# Patient Record
Sex: Male | Born: 1970 | Race: White | Hispanic: No | Marital: Married
Health system: Southern US, Academic
[De-identification: ages and names within clinical notes are randomized; demographics above are authoritative.]

---

## 2018-04-09 ENCOUNTER — Other Ambulatory Visit (HOSPITAL_COMMUNITY): Payer: Self-pay | Admitting: Surgery

## 2021-02-16 IMAGING — MR MRI KNEE RT W/O CONTRAST
4 of 5 series · 28 of 40 positions shown · IV contrast (gadolinium)
Comparison: None.

﻿EXAM:  [id number]   MRI KNEE RT W/O CONTRAST
INDICATION: Right knee pain, injury 7 months ago.
TECHNIQUE: Multiplanar, multisequential MRI of the right knee joint was performed without gadolinium contrast.

[Series 5: PD fat-sat · axial · right · 4.0mm · 0.37mm/px · z∈[-45,+86]mm · 8 of 30 slices shown (1 of 3)]
[im 1/30]
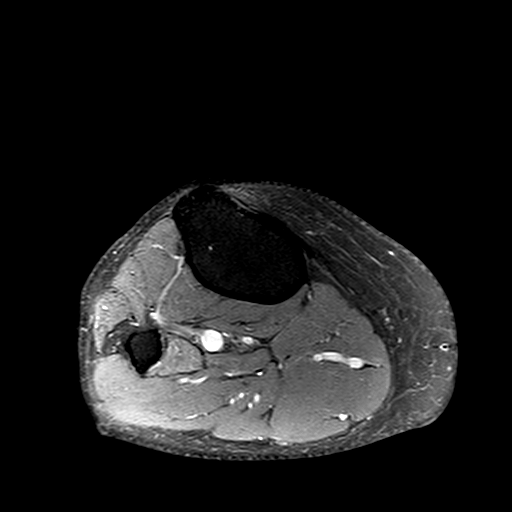
[im 5/30]
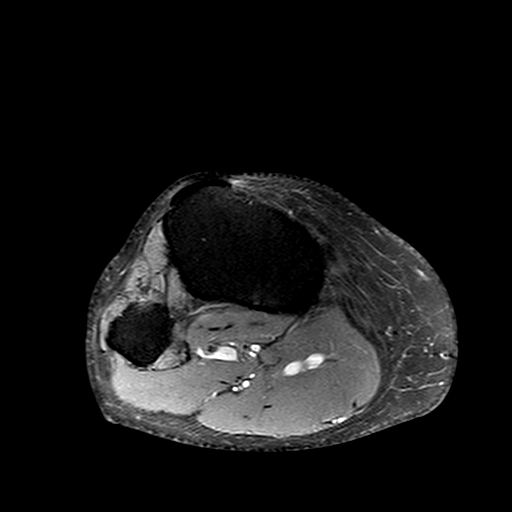
[im 9/30]
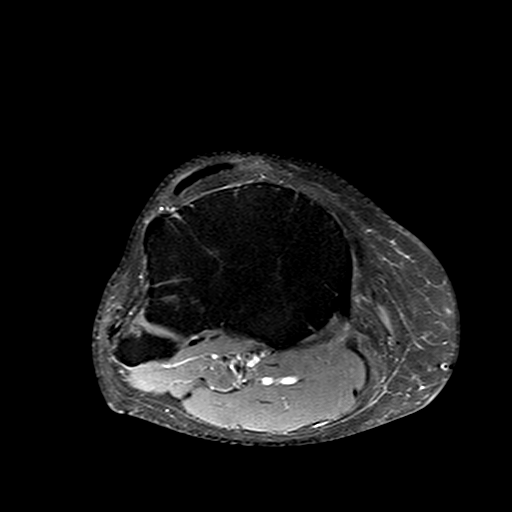
[im 13/30]
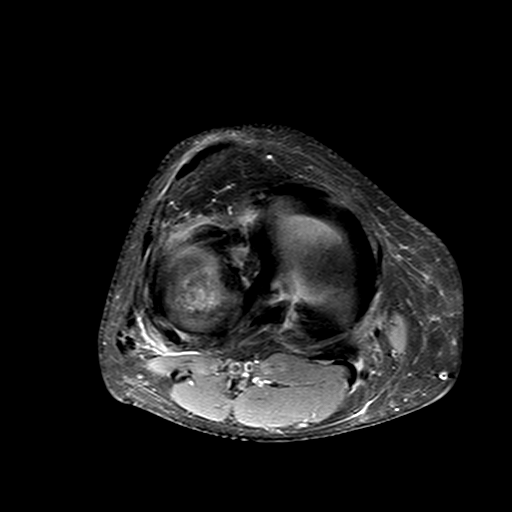
[im 17/30]
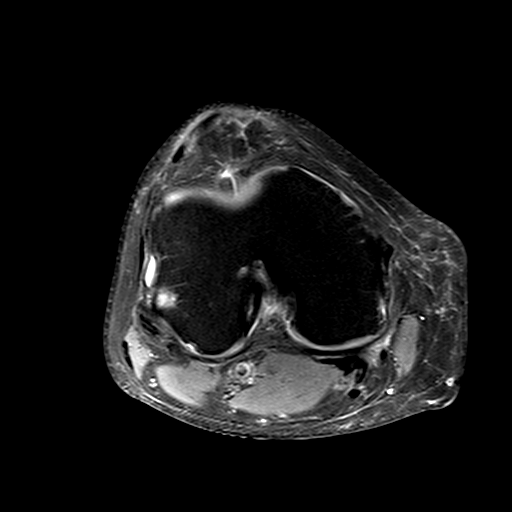
[im 21/30]
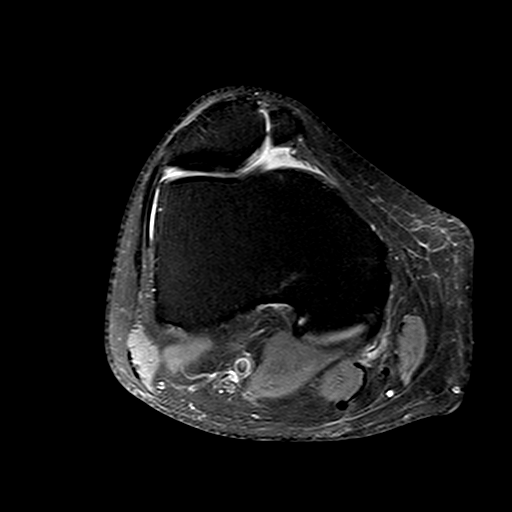
[im 25/30]
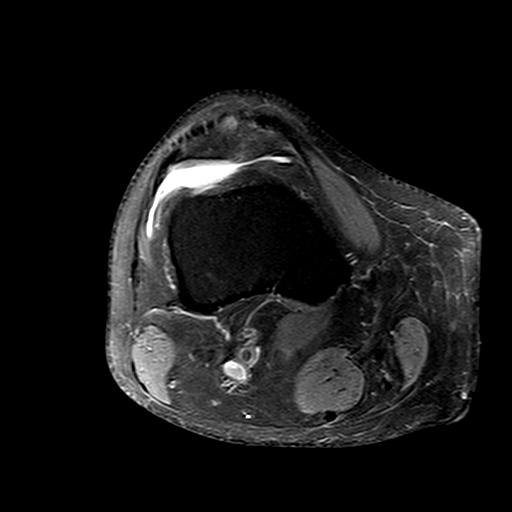
[im 30/30]
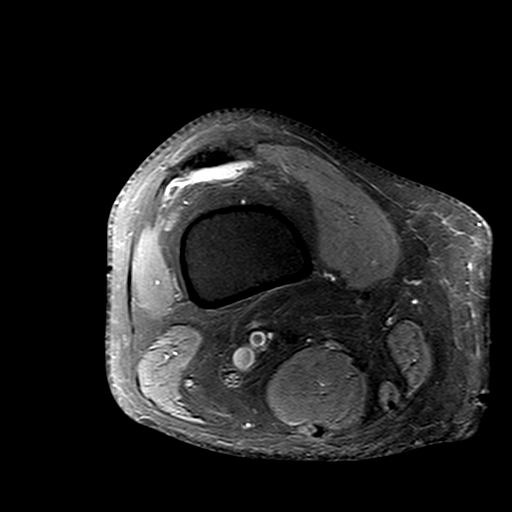

[Series 6: PD fat-sat · sagittal · right · 3.0mm · 0.50mm/px · 8 of 30 slices shown (2 of 3)]
[im 1/30]
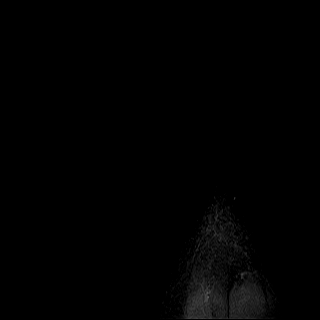
[im 5/30]
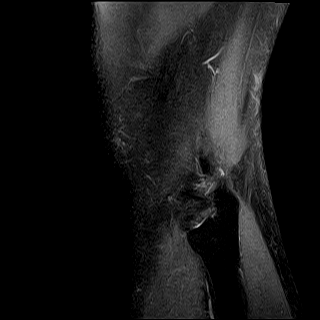
[im 9/30]
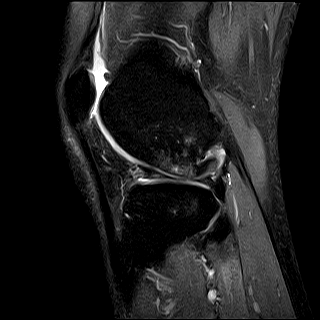
[im 13/30]
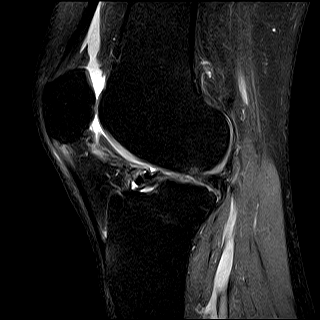
[im 17/30]
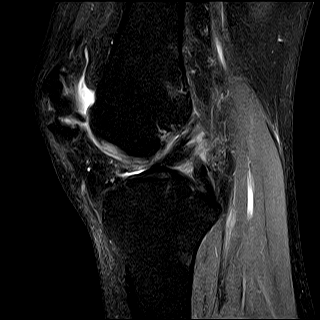
[im 21/30]
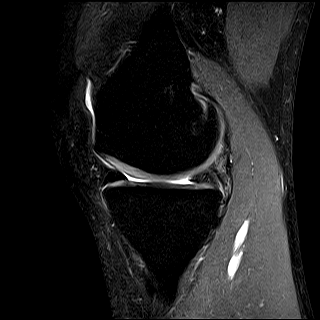
[im 25/30]
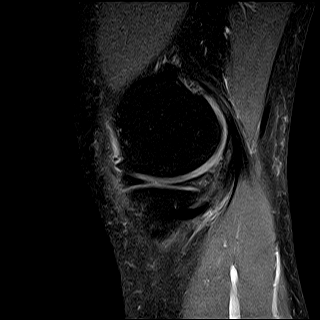
[im 30/30]
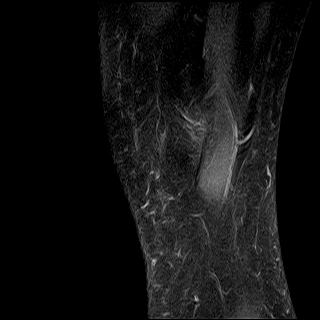

[Series 7: T1 · sagittal · right · 3.0mm · 0.42mm/px · 4 of 30 slices shown]
[im 1/30]
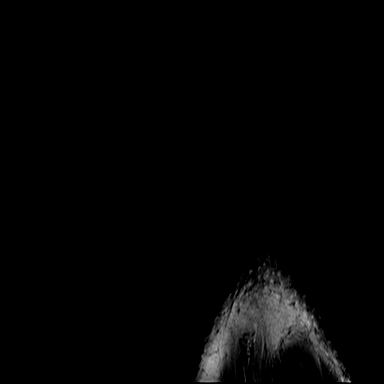
[im 5/30]
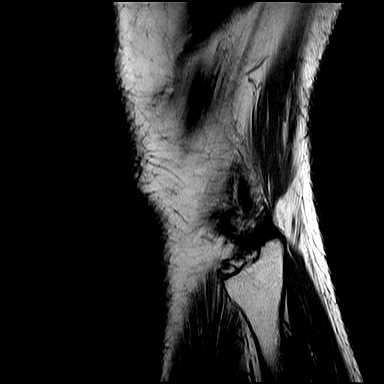
[im 17/30]
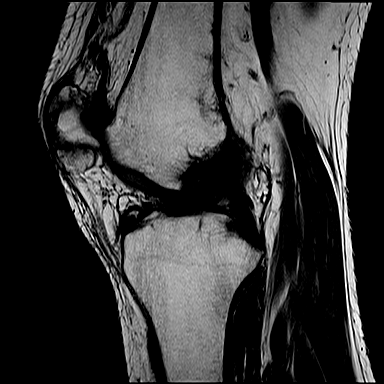
[im 25/30]
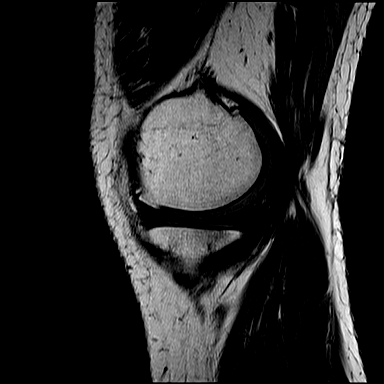

[Series 8: PD fat-sat · coronal · right · 3.0mm · 0.33mm/px · 8 of 27 slices shown (3 of 3)]
[im 1/27]
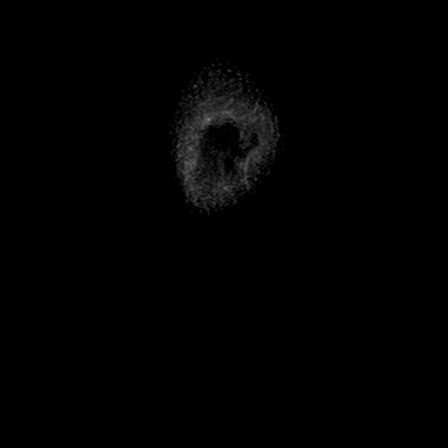
[im 4/27]
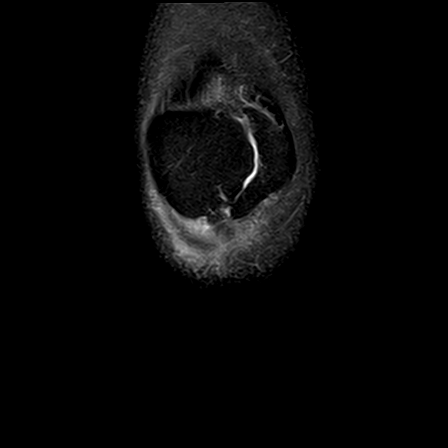
[im 8/27]
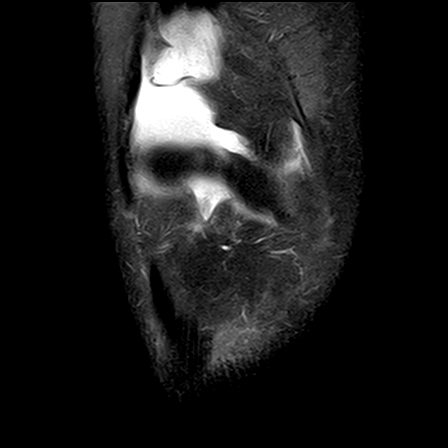
[im 12/27]
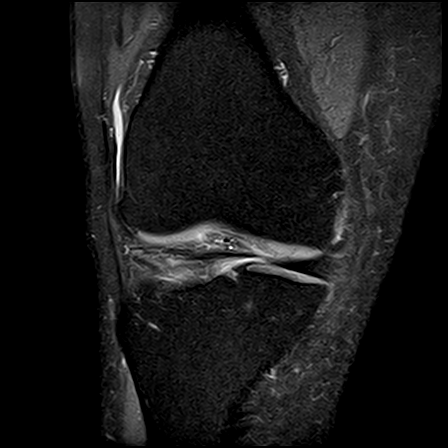
[im 15/27]
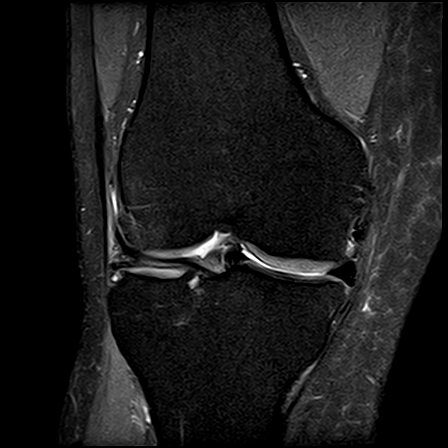
[im 19/27]
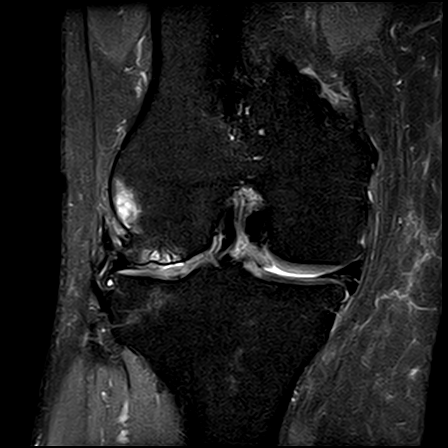
[im 23/27]
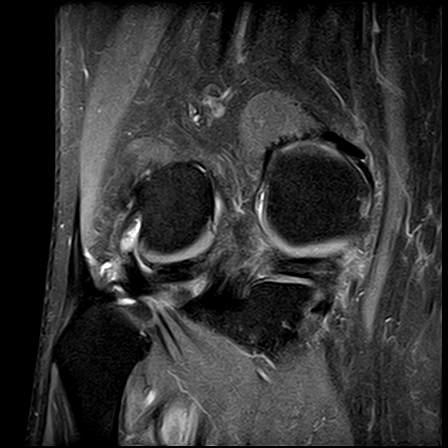
[im 27/27]
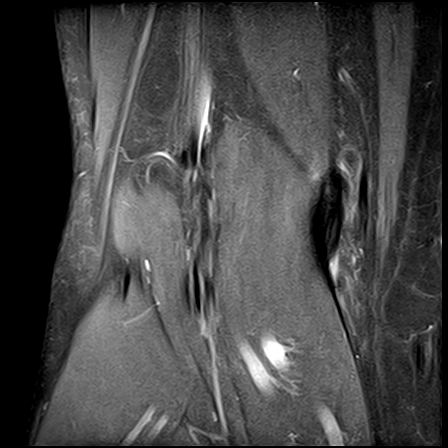

[28 of 40 positions shown; findings below may reference images not displayed]

FINDINGS: There is a chronic nonunited medial patellar facet fracture.  There is a horizontal tear involving the body and posterior horn of the medial meniscus with extension into the meniscal root.  A complex tear involving the body of the lateral meniscus is also noted.  Cruciate and collateral ligaments are intact, within normal limits in morphology and signal intensity.  There is grade 3 chondromalacia of the lateral tibiofemoral and patellofemoral articulations.  Subarticular cystic changes are also noted within the central weight-bearing portion of the lateral femoral condyle.  Extensor mechanism is intact.  Bone marrow signal intensity is normal.  Capsular attachments appear unremarkable.  There is a small suprapatellar effusion.  There is no Baker's cyst.
IMPRESSION: 1. Chronic nonunited medial patellar facet fracture.  

2. Horizontal tear involving the body and posterior horn of the medial meniscus with extension into the meniscal root.

3. Complex tear involving the body of the lateral meniscus. 

4. Grade 3 chondromalacia of the lateral tibiofemoral and patellofemoral articulations.

## 2021-02-16 IMAGING — MR MRI LUMBAR SPINE WITHOUT CONTRAST
5 of 6 series · 32 of 48 positions shown · IV contrast (gadolinium)
Comparison: None.

﻿EXAM:  [id number]   MRI LUMBAR SPINE WITHOUT CONTRAST
INDICATION: Lower back pain.
TECHNIQUE: Multiplanar, multisequential MRI of the lumbar spine was performed without gadolinium contrast.

[Series 7: T2 · coronal · 5.0mm · 0.82mm/px · 8 of 18 slices shown (1 of 3)]
[im 1/18]
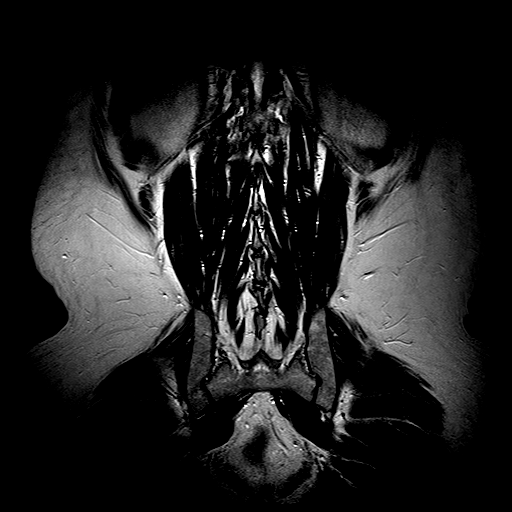
[im 3/18]
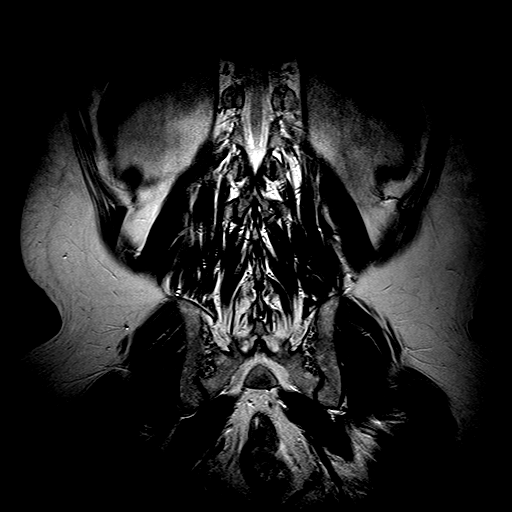
[im 5/18]
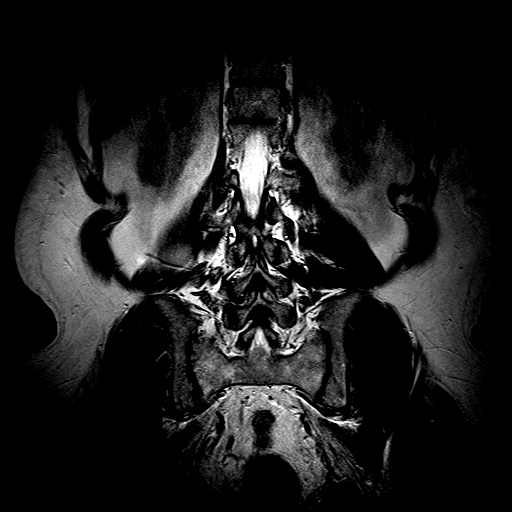
[im 8/18]
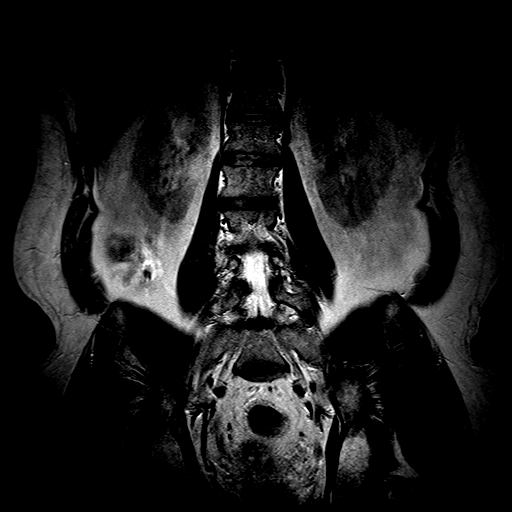
[im 10/18]
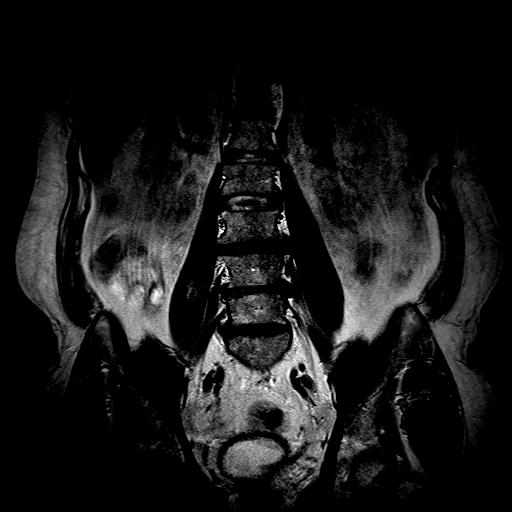
[im 13/18]
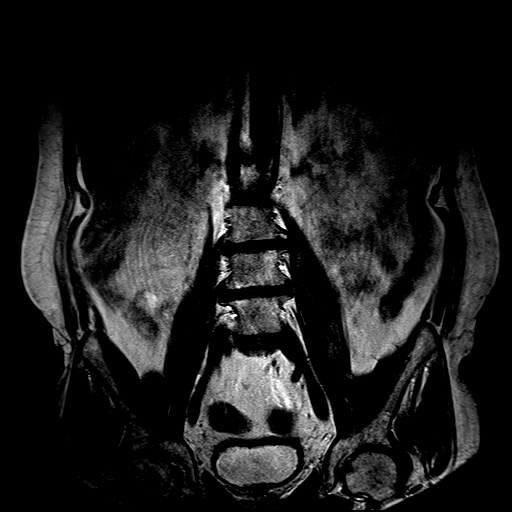
[im 15/18]
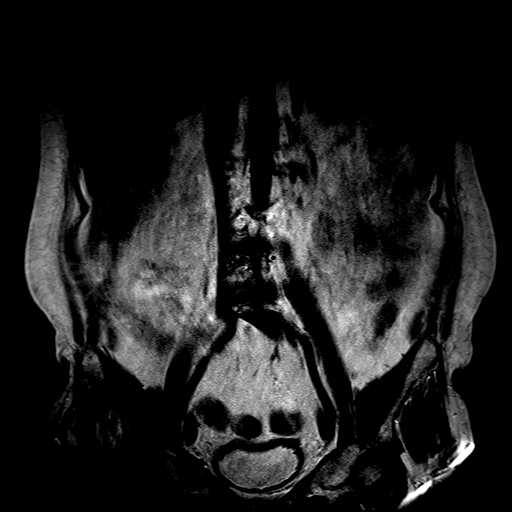
[im 18/18]
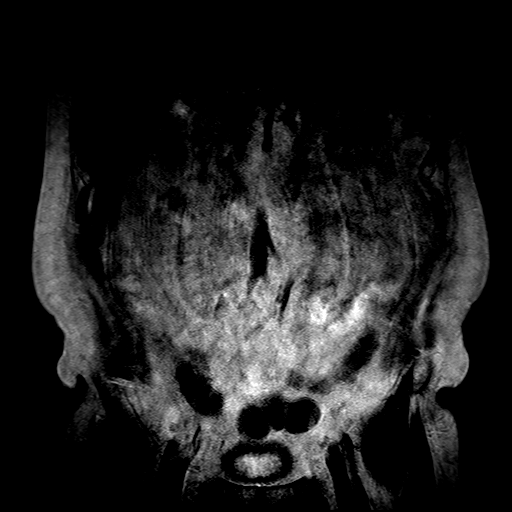

[Series 9: T2 · sagittal · 4.0mm · 0.94mm/px · 6 of 13 slices shown (2 of 3)]
[im 1/13]
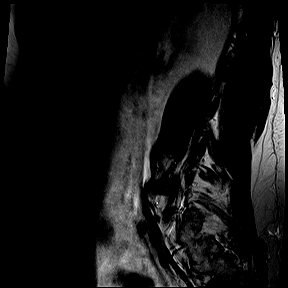
[im 3/13]
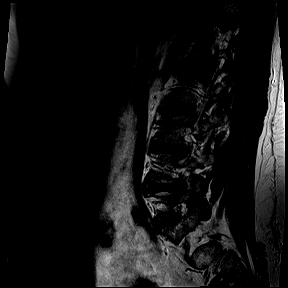
[im 5/13]
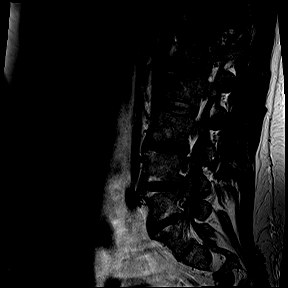
[im 8/13]
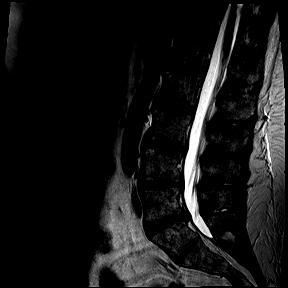
[im 10/13]
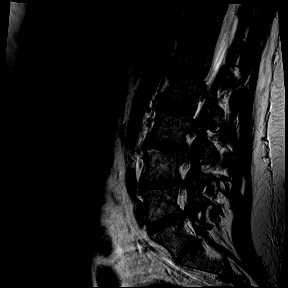
[im 13/13]
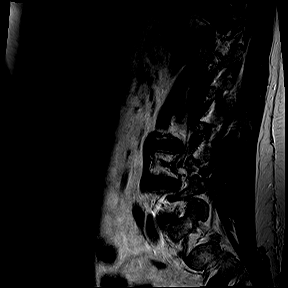

[Series 10: T1 · sagittal · 4.0mm · 0.94mm/px · 6 of 13 slices shown (1 of 2)]
[im 1/13]
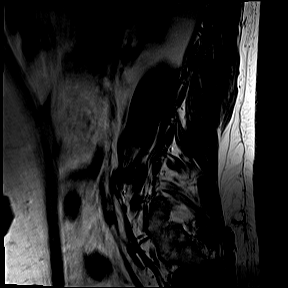
[im 3/13]
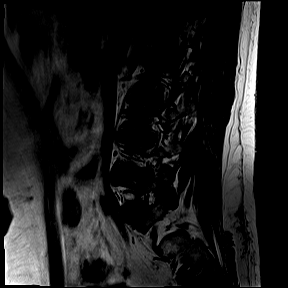
[im 5/13]
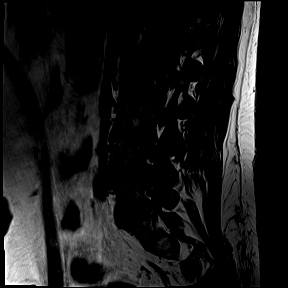
[im 8/13]
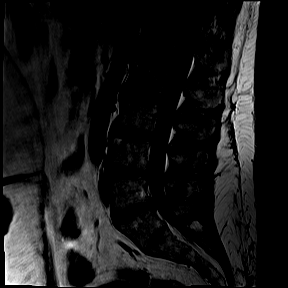
[im 10/13]
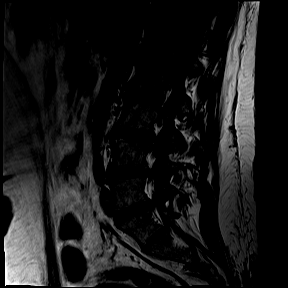
[im 13/13]
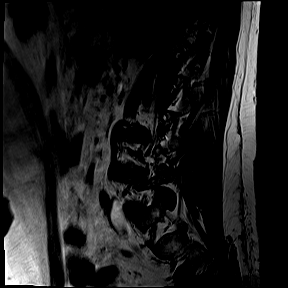

[Series 11: T2 · axial · 4.0mm · 0.52mm/px · z∈[-93,+116]mm · 11 of 23 slices shown (3 of 3)]
[im 1/23]
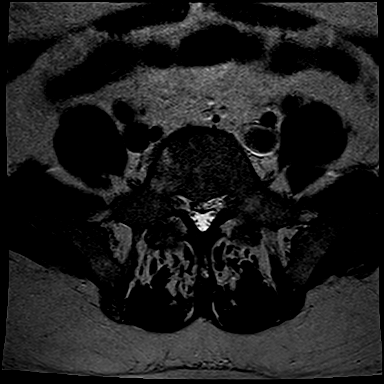
[im 3/23]
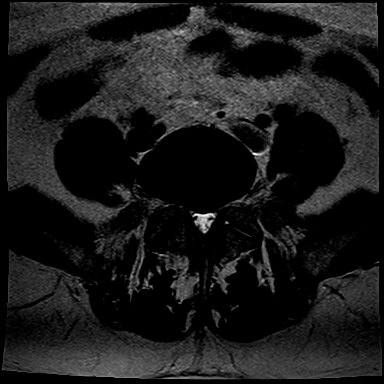
[im 5/23]
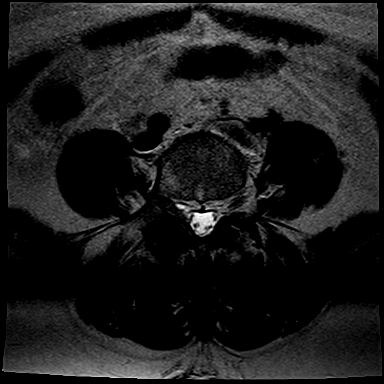
[im 7/23]
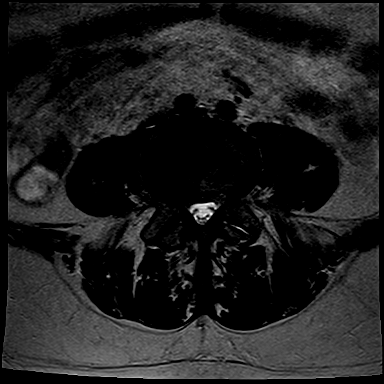
[im 9/23]
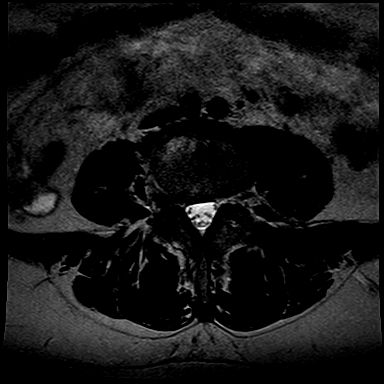
[im 12/23]
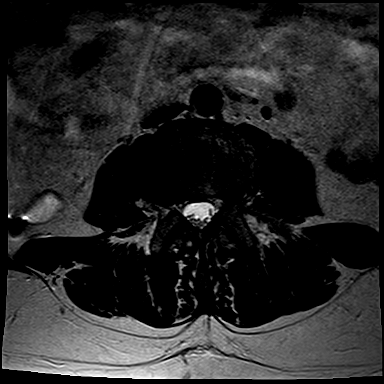
[im 14/23]
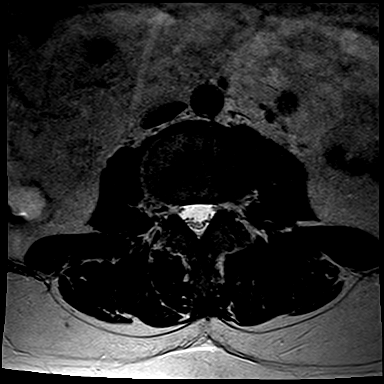
[im 16/23]
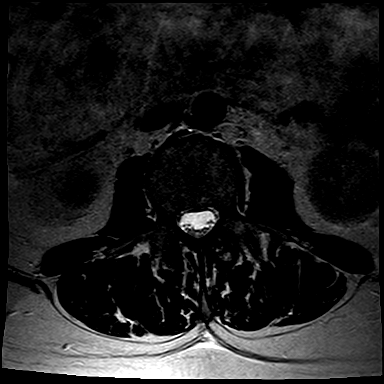
[im 18/23]
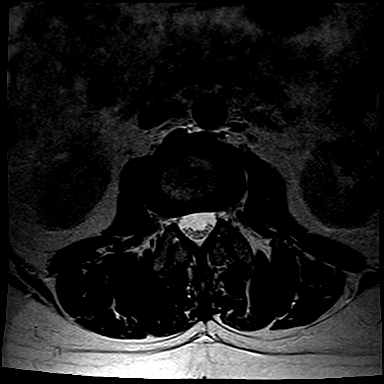
[im 20/23]
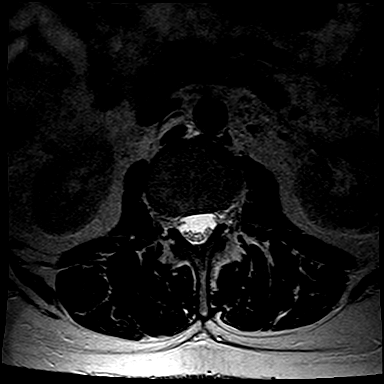
[im 23/23]
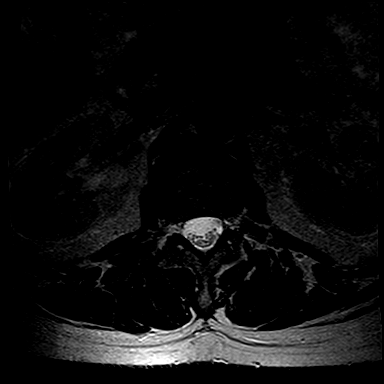

[Series 12: T1 · axial · 4.0mm · 0.52mm/px · 1 of 23 slices shown (2 of 2)]
[im 1/23]
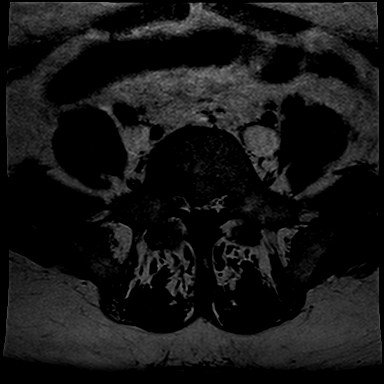

[32 of 48 positions shown; findings below may reference images not displayed]

FINDINGS: Vertebral bodies are normal in height, alignment and signal intensity.  There is no acute fracture or subluxation.  Distal spinal cord is normal in signal intensity and terminates normally at L1 vertebral body level.  Spinal canal is congenitally narrow.  

L1-L2 and L2-L3 levels are unremarkable. 

At L3-L4 level, there is a minimal bulging annulus, minimally effacing the ventral thecal sac.  There is mild bilateral neural foraminal stenosis from bulging annulus without nerve root impingement.

At L4-L5 level, there is a small broad-based central disc bulge, mildly effacing the ventral thecal sac.  There is moderate right and mild left neural foraminal stenosis from facet arthropathy and bulging annulus.  

At L5-S1 level, there is a small broad-based central disc bulge, mildly effacing the ventral thecal sac.  There is mild left and moderate right neural foraminal stenosis from facet arthropathy and bulging annulus.  

Paraspinal soft tissues are unremarkable.
IMPRESSION: 1. Small disc bulges at multiple levels without significant spinal stenosis at any level.

2. Multilevel neural foraminal stenosis as detailed above.

## 2022-01-18 IMAGING — MR MRI CERVICAL SPINE WITHOUT CONTRAST
4 of 5 series · 26 of 48 positions shown · IV contrast (gadolinium)
Comparison: None available.

﻿EXAM:  [id number]   MRI CERVICAL SPINE WITHOUT CONTRAST
INDICATION: Neck pain with bilateral upper extremity radiculopathy. Recent fall.
TECHNIQUE: Multiplanar multisequential MRI of the cervical spine was performed without gadolinium contrast.

[Series 5: T2 · sagittal · 3.0mm · 0.78mm/px · 7 of 11 slices shown (1 of 2)]
[im 1/11]
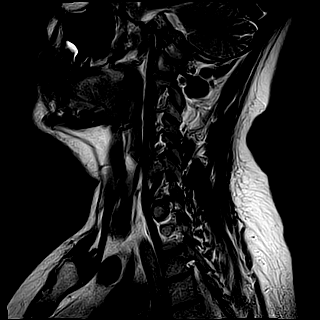
[im 2/11]
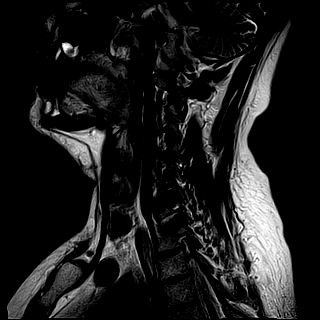
[im 4/11]
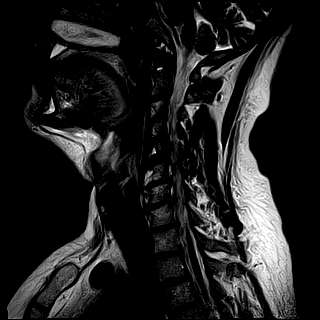
[im 6/11]
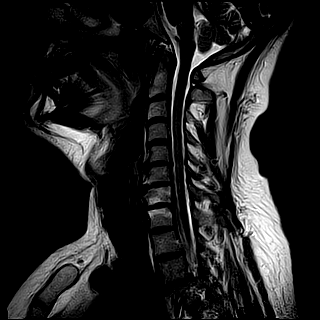
[im 7/11]
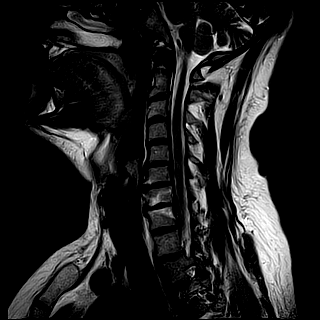
[im 9/11]
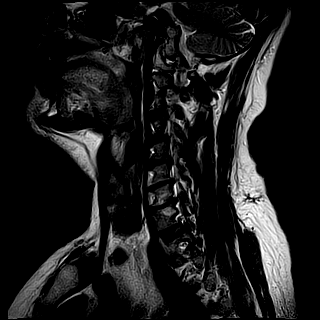
[im 11/11]
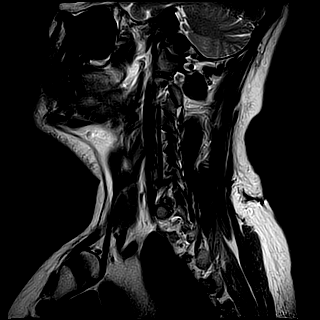

[Series 6: T1 · sagittal · 3.0mm · 0.49mm/px · 6 of 11 slices shown]
[im 1/11]
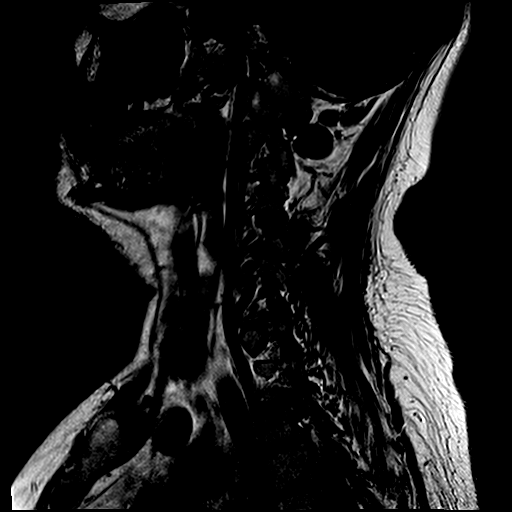
[im 2/11]
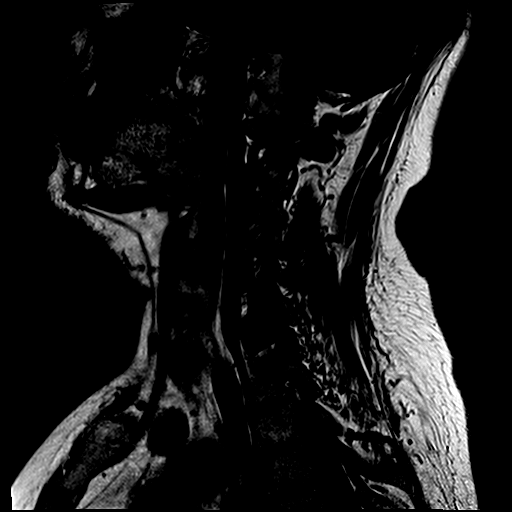
[im 4/11]
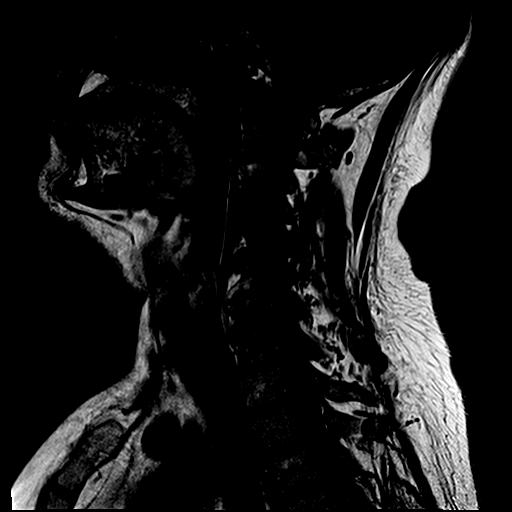
[im 6/11]
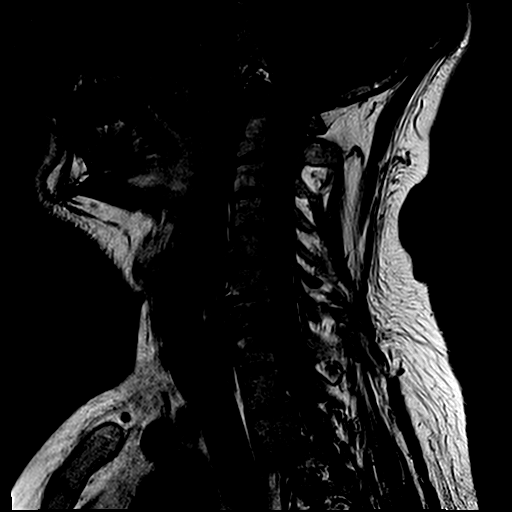
[im 7/11]
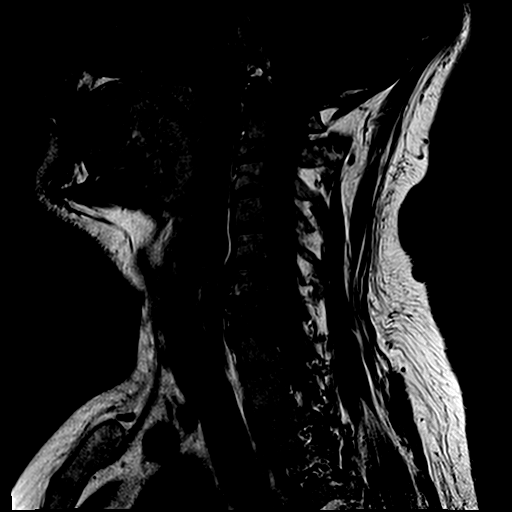
[im 9/11]
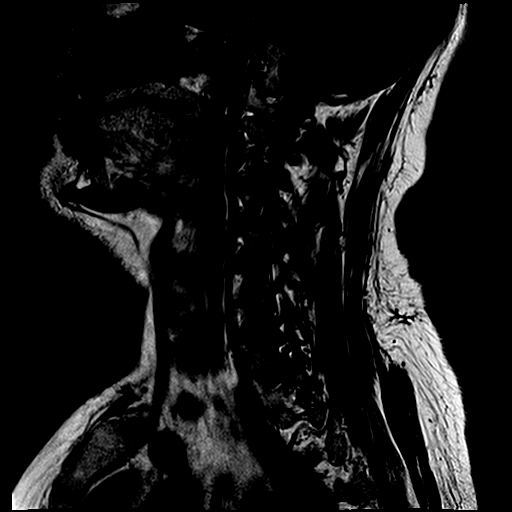

[Series 7: STIR · sagittal · 3.0mm · 0.49mm/px · 3 of 11 slices shown]
[im 2/11]
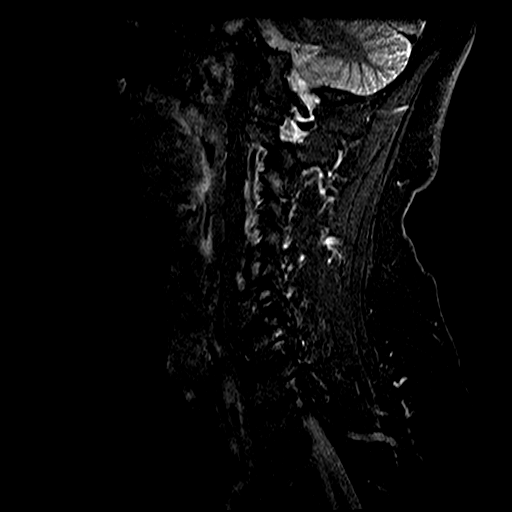
[im 6/11]
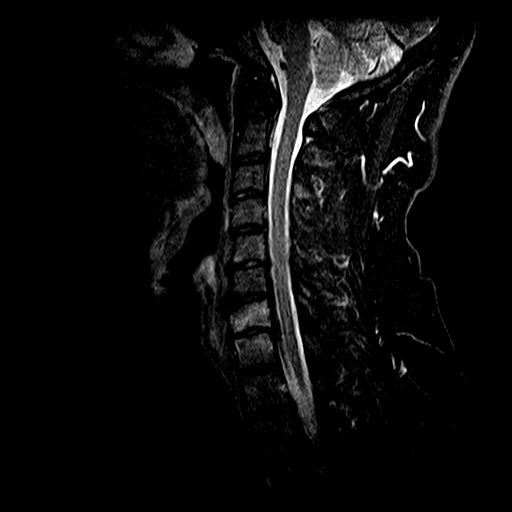
[im 9/11]
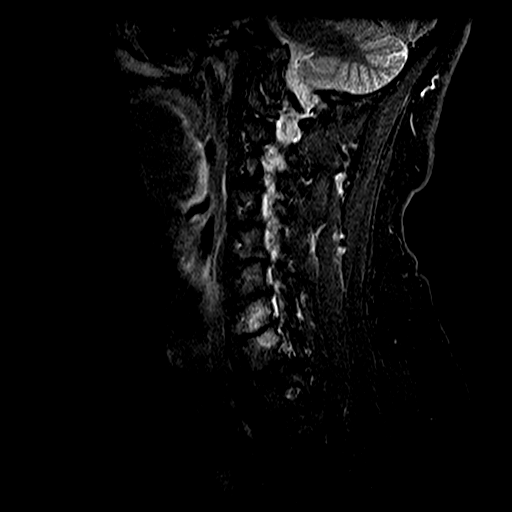

[Series 9: T2 · axial · 3.5mm · 0.40mm/px · z∈[-44,+39]mm · 10 of 26 slices shown (2 of 2)]
[im 2/26]
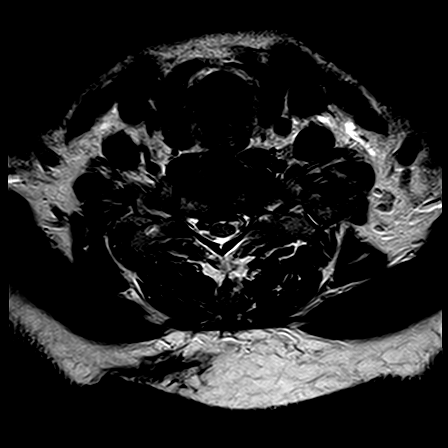
[im 4/26]
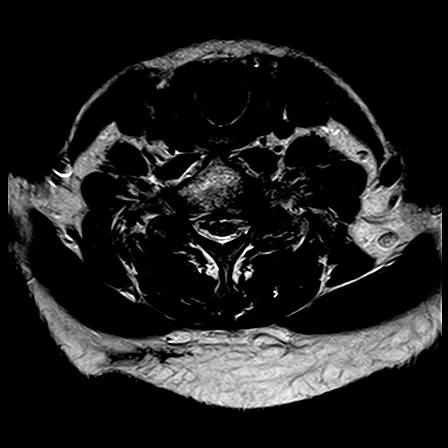
[im 6/26]
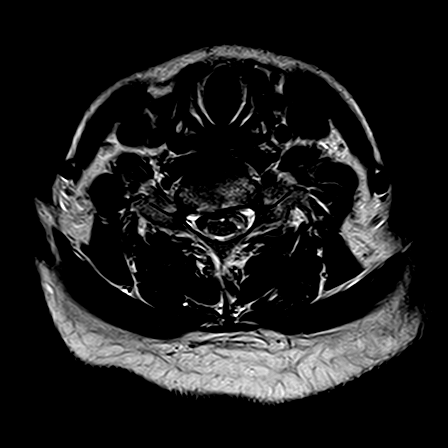
[im 9/26]
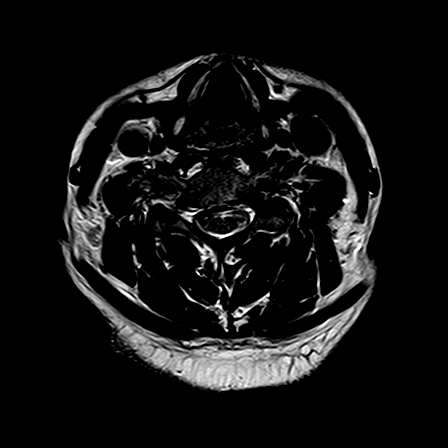
[im 12/26]
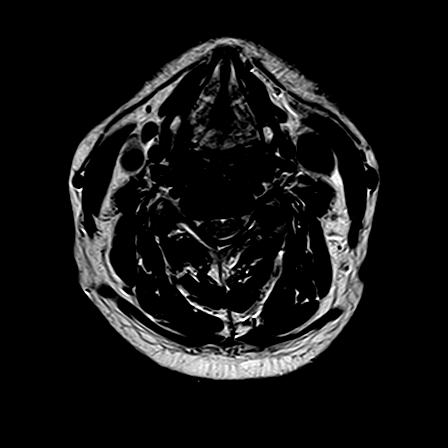
[im 14/26]
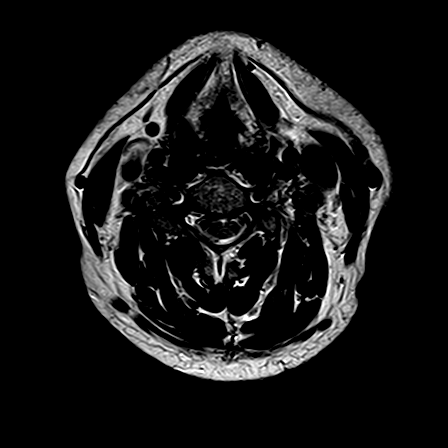
[im 16/26]
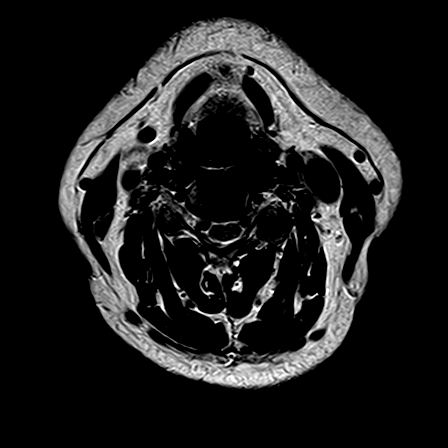
[im 19/26]
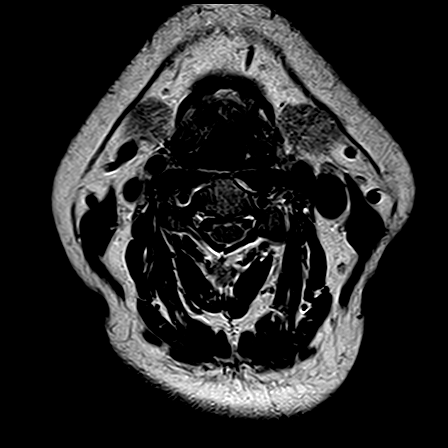
[im 22/26]
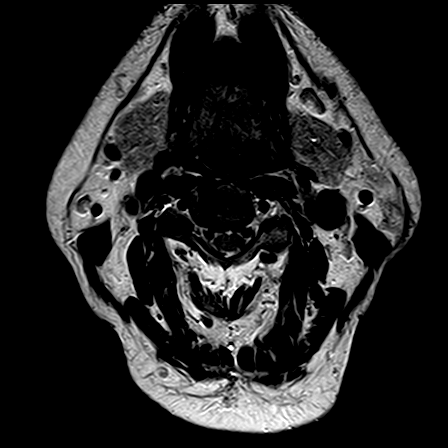
[im 26/26]
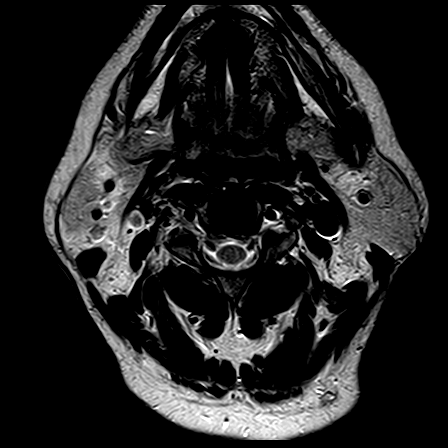

[26 of 48 positions shown; findings below may reference images not displayed]

FINDINGS: Vertebral bodies are normal in height, alignment and signal intensity. There is no acute fracture or subluxation. Visualized spinal cord is normal in signal intensity without evidence of compression at any level.

C2-3 level is unremarkable. 

At C3-4 level, there is moderate to severe right neural foraminal stenosis from facet and uncovertebral joint hypertrophy. 

C4-5 level is unremarkable. 

At C5-6 level, there is a small broad-based central disc bulge mildly effacing the ventral CSF. There is severe bilateral neural foraminal stenosis from facet and uncovertebral joint hypertrophy.

At C6-7 level, there is a minimal bulging annulus, minimally effacing the ventral CSF.  There is moderate to severe left and severe right neural foraminal stenosis from facet and uncovertebral joint hypertrophy. 

At C7-T1 level, there is a small broad-based central disc bulge partially effacing the ventral CSF. Modic type 1 changes are also noted within the inferior endplate of C7 and superior endplate of T1 vertebral body.  There is severe bilateral neural foraminal stenosis from facet and uncovertebral joint hypertrophy.

Paraspinal soft tissues are unremarkable.
IMPRESSION: 1. No evidence of acute injury.  

2. No significant disc herniation or spinal stenosis at any level. 

3. Multilevel neural foraminal stenosis as detailed above.

## 2022-01-18 IMAGING — MR MRI THORACIC SPINE WITHOUT CONTRAST
4 of 5 series · 26 of 48 positions shown · IV contrast (gadolinium)
Comparison: None available.

﻿EXAM:  [id number]   MRI THORACIC SPINE WITHOUT CONTRAST
INDICATION: Upper back pain, recent fall.
TECHNIQUE: Multiplanar multisequential MRI of the thoracic spine was performed without gadolinium contrast.

[Series 5: T2 · sagittal · 4.0mm · 0.78mm/px · 8 of 13 slices shown (1 of 2)]
[im 1/13]
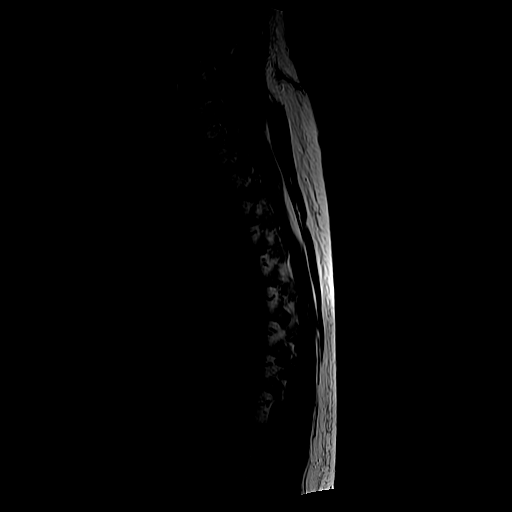
[im 2/13]
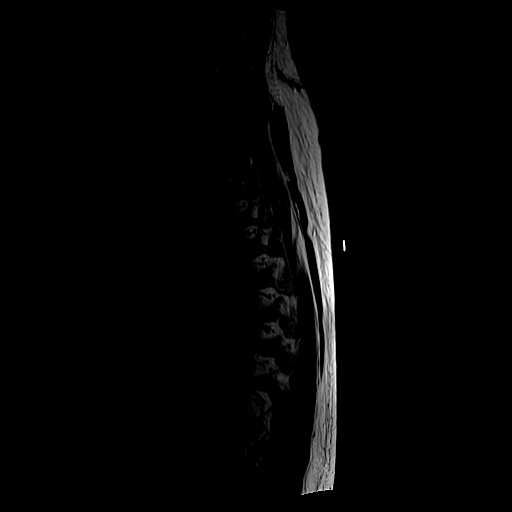
[im 5/13]
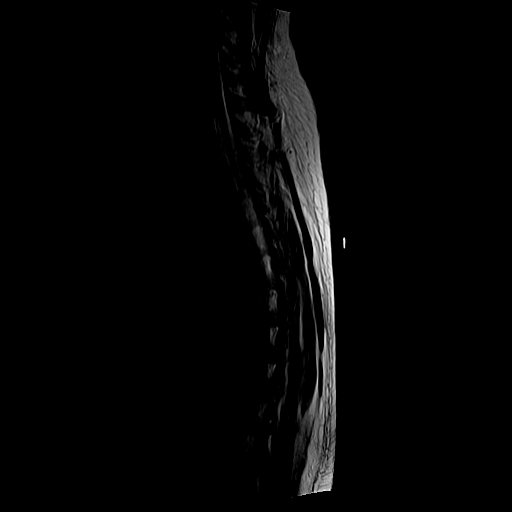
[im 6/13]
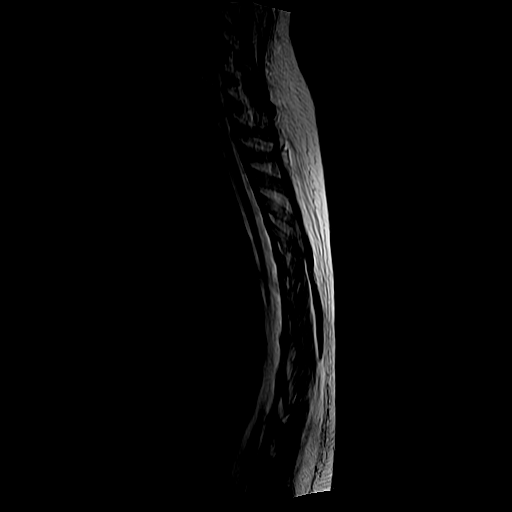
[im 7/13]
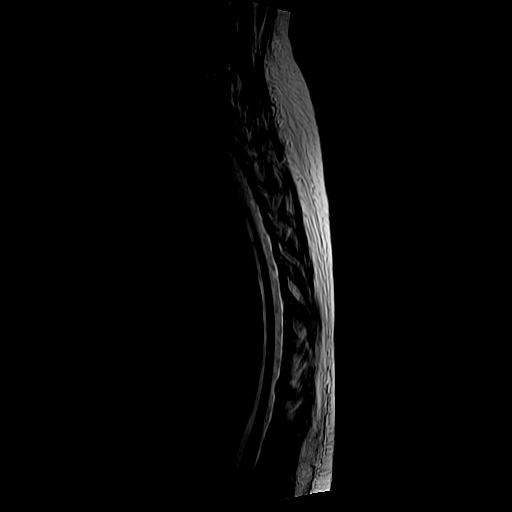
[im 9/13]
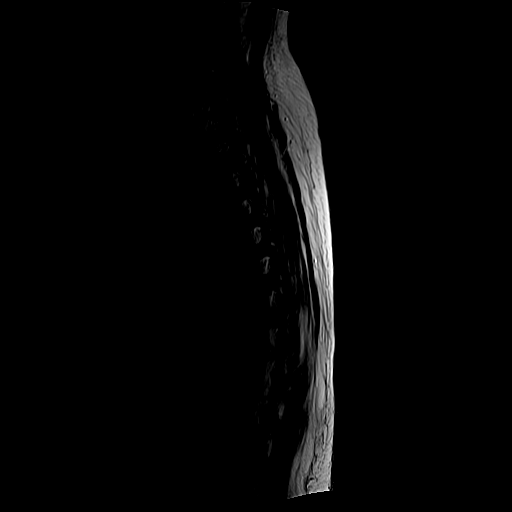
[im 11/13]
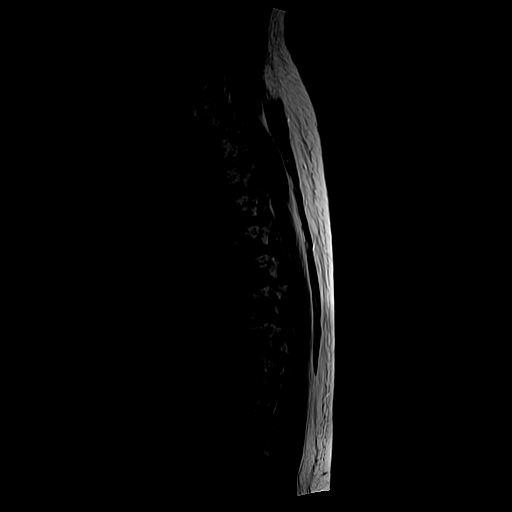
[im 13/13]
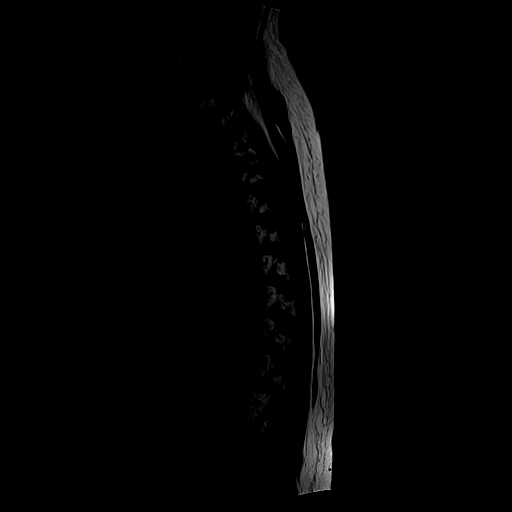

[Series 6: T1 · sagittal · 4.0mm · 0.78mm/px · 6 of 13 slices shown (1 of 2)]
[im 1/13]
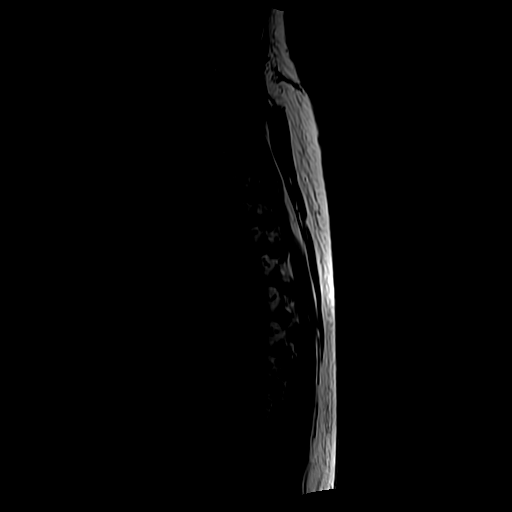
[im 2/13]
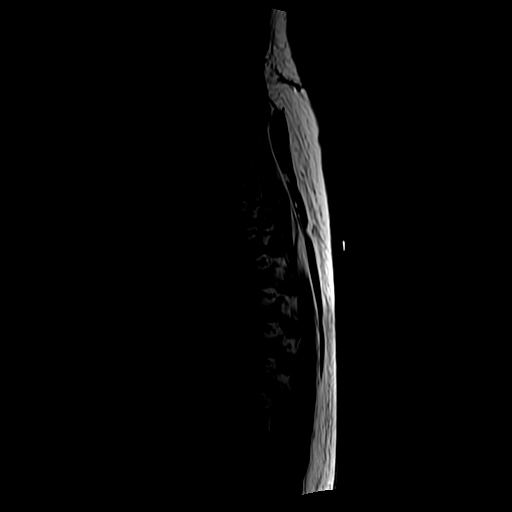
[im 5/13]
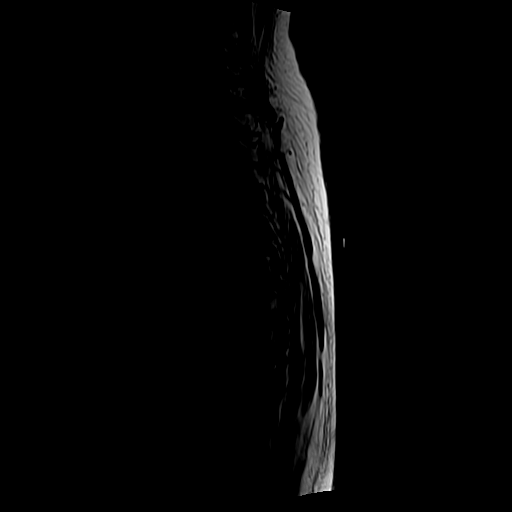
[im 6/13]
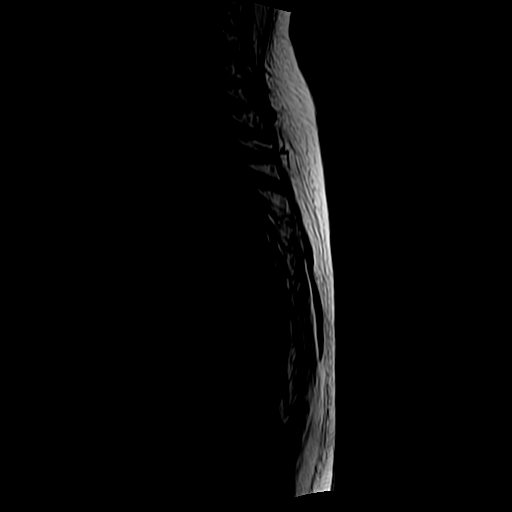
[im 7/13]
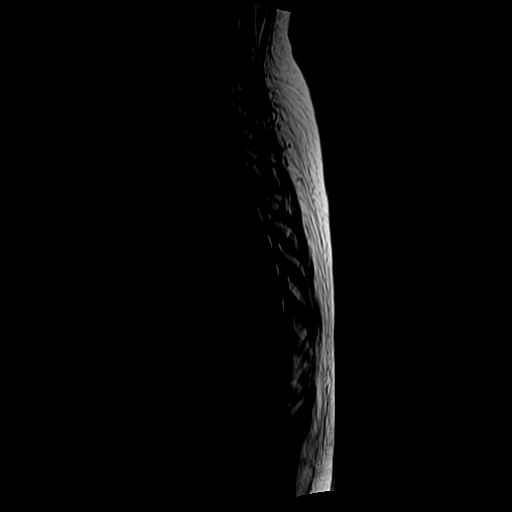
[im 11/13]
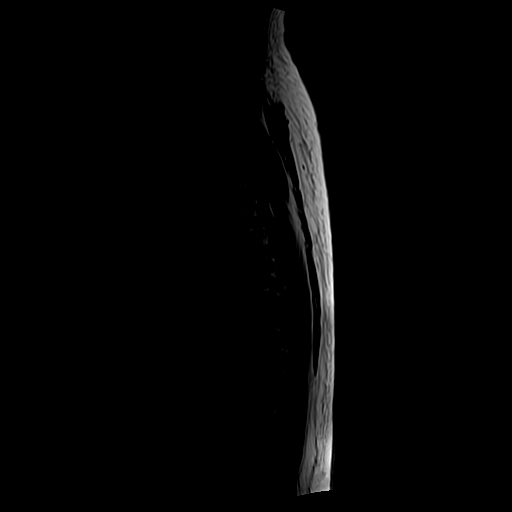

[Series 8: T2 · axial · 4.0mm · 0.62mm/px · z∈[-260,-67]mm · 9 of 12 slices shown (2 of 2)]
[im 1/12]
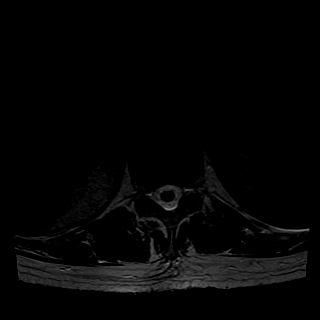
[im 2/12]
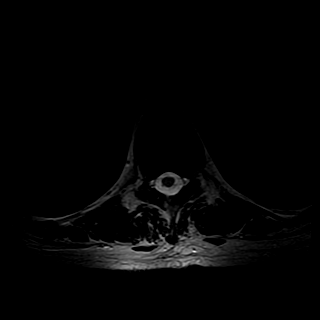
[im 3/12]
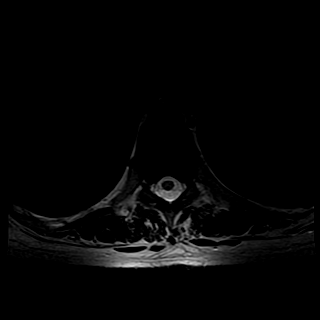
[im 5/12]
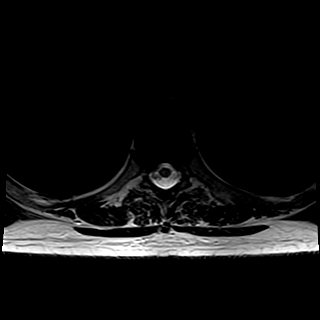
[im 6/12]
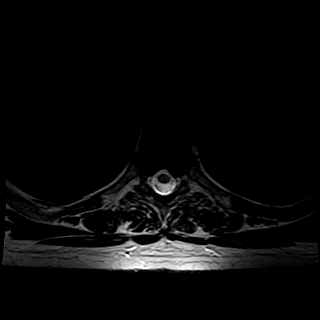
[im 7/12]
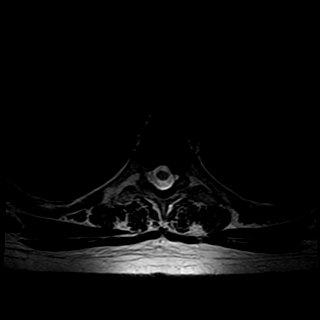
[im 9/12]
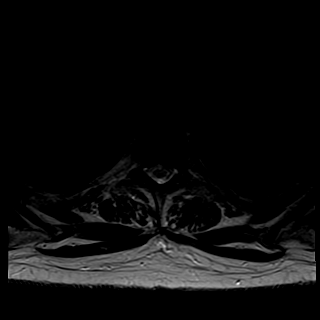
[im 10/12]
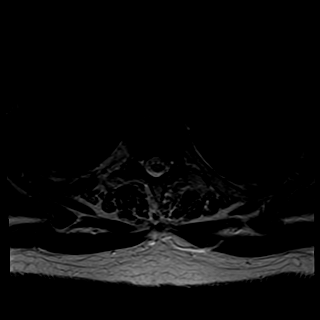
[im 12/12]
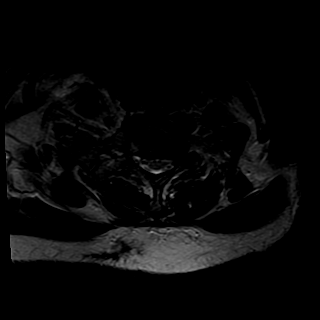

[Series 9: T1 · axial · 4.0mm · 0.62mm/px · z∈[-240,-96]mm · 3 of 12 slices shown (2 of 2)]
[im 2/12]
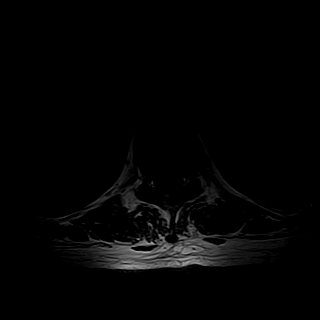
[im 6/12]
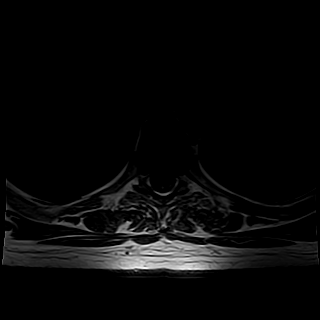
[im 10/12]
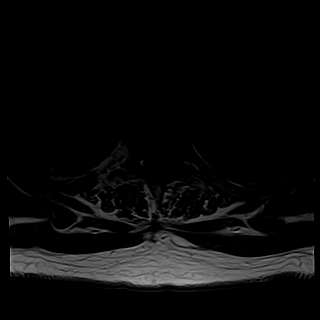

[26 of 48 positions shown; findings below may reference images not displayed]

FINDINGS: Vertebral bodies are normal in height, alignment and signal intensity. There is no acute fracture or subluxation. Visualized spinal cord is also normal in signal intensity without evidence of compression at any level. 

There is no significant disc herniation, spinal canal or neural foraminal stenosis at any level. 

Paraspinal soft tissues are also unremarkable. There is no pleural effusion.
IMPRESSION: Unremarkable exam.

## 2022-03-20 ENCOUNTER — Ambulatory Visit (HOSPITAL_COMMUNITY)
Admission: RE | Admit: 2022-03-20 | Discharge: 2022-03-20 | Disposition: A | Discharge status: Still In Hospital | Payer: Worker's Comp, Other unspecified | Source: Ambulatory Visit | Attending: ORTHOPEDIC, SPORTS MEDICINE | Admitting: ORTHOPEDIC, SPORTS MEDICINE

## 2022-03-20 ENCOUNTER — Other Ambulatory Visit: Payer: Self-pay

## 2022-03-20 DIAGNOSIS — S93491D Sprain of other ligament of right ankle, subsequent encounter: Secondary | ICD-10-CM | POA: Insufficient documentation

## 2022-03-20 NOTE — Progress Notes (Addendum)
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  When he fell, he twisted his upper body and the leg was underneath him when he fell.  Tried to brace with his R elbow on the wall.  Pt was seen at [facility] initially.  Referred him to Dr. Cleophas Dunker, MD.  Per pt, Dr. Cleophas Dunker only medically assesses/treats from the hip down and he referred pt to Dr. Marny Lowenstein for assessment/treatment of the Cervical/Thoracic/Lumbar Spine pain.  Pt has been seeing both doctors for some time.  He reports that it has taken a year for him to get scheduled for a Physical Therapy Evaluation.  Dr. Allena Katz was last seen 02/13/2022.  Dr. Allena Katz recommended physical therapy evaluation and treatment for cervical/thoracic/lumbar spinal pain for 2-3 times a week for 6 weeks.  Pt has not had any physical therapy treatment up to this point.  Pt also sustained a ankle injury and has a prescription for physical therapy evaluation and treatment for Sprain of the anterior taloficular ligament of the R ankle, subsequent encounter (H37.169C) that was written by Dr. Leonia Reader, MD for 2x/week for 6 weeks for AROM/PROM/strengthening/HEP/Proprioceptive training.    NOTE - Pt and therapist discussed that it is the hospital's policy to treat one area of injury at a time.  Today's evaluation will focus on evaluation/treatment/POC for his cervical/thoracic/lumbar spine.  Pt's ankle injury will be evaluated and treated at another time and is not included in this evaluation.     Date of onset: 01/27/2021    Mechanism of  injury: fall    Previous episodes/treatments: None    Medications for this problem:  Gabapentin for neck, thorax, and lumbar spine pain.     Diagnostic tests: x-rays and MRIs - cervical and lumbar buldges and hernations,   C3-4 moderate to severe R neural foraminal stenosis from facet and uncovertebral joint hypertrophy  C5-C6 small broad-based central disc bulge mildly effacing the ventral CSF - severe bilateral neural foraminal stenosis from facet and uncovertebral joint hypertrophy.  C7-T1 small broad-based central disc bulge partially effacing the ventral CSF.  Modic type 1 changes are also noted within the inferior endplate of C7 and superior endplate of T1 vertebral body - severe bilateral neural foraminal stenosis from facet and uncovertebral joint hypertrophy  Thoracic - vertebral bodies are normal height, alignment, and signal intensity, no fractures, no significant disc herniations, no sinal canal/foraminal stenosis - unremarkable  L3-L4 miniimal bluging annulas, minimally effacing the ventral thecal sac. Mild bilateral neural foraminal stenosis from bluging annulus without nerve root impingement  L4-L5 small broad-based central disc bulge, mildly effacing the ventral thecal sac.  Moderate to mild R and L neural foraminal stenosis from facet arthropathy and bulging annulus.  L5-S1 small broad-based central disc bulge, mildly effacing the ventral thecal sac.  Mild R and L neural foraminal stenosis from facet arthropathy and bulging annulus.        Patient goals: REDUCE PAIN, NORMALIZE FUNCTION, and  improve strength    Occupation:  working, Scientist, physiological    Next MD visit: 04/26/2022    Pain location/description:   Cervical - occipital notch to the beginning of the thoracic spine (sometimes radiates "pins and needles" up to the occipical crown and sometimes gets "pain, pins, and needles" into the chest, arms, and hands causing him to drop things)          Thoracic spine - pretty well ok but after he sits  for awhile - gets stiff, stands, back with pop with pain, and then pain will be relieved  Lumbar spine - constant pain - heavy ache, radiates sharp, firey, hot pain down the back of the leg into the L foot and sometimes it will come around the front of the groin down to the knee. There are times the leg will buckle and sometimes it feels heavy.        Pain frequency:   Cervical - intermittent  Thoraicic - intermittent  Lumbar - constant    Pain rating:   Cerical - Now- 0/10 Best - 0/10 and Worst - 7/10  Thoracic - Now - 0/10 Best - 0/10 Worst 5/10  Lumbar Now 1-2/10 achy/heavy Best - 1/10 Worst 8/10    Pain increases with: STAND, SIT, BENDING, and LIFTING and positional           decreases with : HEAT, COLD, MEDICATION, and MASSAGE and Inversion table, and stretches    Sensation: lack of sensation on the R back of the thigh, into the R calf, and R foot.    Weakness: yes    Sleep affected: yes    Bowel/bladder problems:No    Subjective Functional Reports:  Sitting: LIMITED  Standing: LIMITED  Walking: LIMITED1.  Lifting: LIMITED          OBJECTIVE    Patient-Specific Functional Score:    Problem Score   1. Walk from the office without low back pain 2   2. Mow my yard 0   3. Stand and cook a full meal without sitting or pain 2   Average 1.33       AROM   right left   Flexion 27 cm from floor   Extension 20 degrees   Sidebend 42 cm floor 47 cm from floor     ROM comments extension was worse (pain with all other movements but with extension pt reports "burning down the back and thigh"    Strength     right left   Hip flexion (L1,2) 3+/5 5/5   Hip extension (L5,S1,2) 3+/5 5/5   Hip Abduction  3+/5 5/5   Hip Adduction 3+/5 5/5   Knee flexion (S1) 4/5 5/5   Knee extension (L2-4) 3+/5 5/5   Ankle DF (L4-5) 5/5 5/5   Ankle PF (S1,2) 5/5 5/5   Lumbar extension 5/5 5/5   Lumbar flexion 5/5 5/5   Lumbar lateral flexion 5/5 5/5   Lumbar rotation 5/5 5/5     Strength comments all testing on the R side hurts with the worse being  hip extension on the R.     Posture: WNL    Gait: NO ASSISTIVE DEVICE    Treatment provided:REVIEW OF POC AND GOALS WITH PATIENT, ALL QUESTIONS ANSWERED and THERAPEUTIC EXERCISE     HEP:   Access Code: [email or web address]  URL: [email or web address]  Date: 03/20/2022  Prepared by: Oliver Hum    Exercises  - Seated Long Arc Quad  - 1 x daily -  7 x weekly - 3 sets - 10 reps  - Standing Hip Abduction with Counter Support  - 1 x daily - 7 x weekly - 3 sets - 10 reps  - Standing Hip Extension with Counter Support  - 1 x daily - 7 x weekly - 3 sets - 10 reps  - Standing March with Counter Support  - 1 x daily - 7 x weekly - 3 sets - 10 reps  - Mini Squat with Counter Support  - 1 x daily - 7 x weekly - 3 sets - 10 reps  - Seated Table Hamstring Stretch  - 1 x daily - 7 x weekly - 3 sets - 10 reps          ASSESSMENT    Impression: Johnathan Washington is a 51 y/o male who sustained injuries to his neck, mid back, and low back in addition to an ankle injury from a fall on 01/27/2021.  This Physical Therapy evaluation was to assess and develop a POC for his cervical, thoracic, and lumbar spine.  Pt presented at the evaluation with limited spinal mobility, decreased LE strength, limited functional activities, and pain with movement.  Pt would benefit from (but not limited to) skilled outpatient physical therapy to address core strengthening, stretching, posture reeducation, gait training, Watkin's Protocol, and c-spine strengthening/stabilization.  He would also benefit from modalities and intervention for pain management including but not limited to massage therapy, manual/mechanical traction.  Recommend outpatient skilled physical therapy for the above deficits and treatments.     Rehab potential: FAIR      Short Term Goals: 6 Weeks   -Pt will decrease pain in his cervical spine so that he reports that it is a 0/10 50% of the time.   -Pt will decrease pain in his thoracic spine so that he reports that it is a 0/10 50% of the  time.   -Pt will decrease pain in his lumbar spine to intermittent and report no radiculopathy into his legs more than 4/7 days a week.    -Pt will improve his PSFS (functionals skills) to an average of 3 or better.   -Pt will improve his forward trunk flexion to 20 cm to the floor or less.   -Pt will improve his side flexion bilaterally to 35 cm to the floor or less.   -Pt will improve all LE/trunk/cervical MMT to 4/5.       Long Term Goals: 12  Weeks   -Pt will decrease pain in his cervical spine so that he reports that it is a 0/10 75% of the time.   -Pt will decrease pain in his thoracic spine so that he reports that it is a 0/10 75% of the time.   -Pt will decrease pain in his lumbar spine to intermittent and report no radiculopathy into his legs more than 2/7 days a week.    -Pt will improve his PSFS (functionals skills) to an average of 6 or better.   -Pt will improve his forward trunk flexion to 15 cm to the floor or less.   -Pt will improve his side flexion bilaterally to 30 cm to the floor or less.   -Pt will improve all LE/trunk/cervical MMT to 5/5.     PLAN  Patient will attend 2-3 times per week x 12 weeks. Therapy may include, but is not limited to THERAPEUTIC EXERCISES, MYOFASCIAL/JOINT MOBILIZATION, POSTURE/BODY MECHANICS, ERGONOMIC TRAINING, HOME INSTRUCTIONS, KINESIOTAPE, MECHANICAL TRACTION, NEURO RE-EDUCATIOIN, and CERVICAL TRACTION UP TO  20# , WATKIN'S PROTOCOL.    Plan for next visit core strengthening, massage therapy, SI joint stabilization, quad/psoas stretching, lumbar traction as tolerated, cervical traction up to 20#, aquatics, ROM exercises, ADL training, Watkin's protocol, cervical spine strengthening/stabilization       Evaluation complexity:   Personal factors impacting POC: FREQUENT OR CHRONIC PAIN and PRE-EXISTING FUNCTIONAL LIMITATIONS   Co-morbidities impacting POC:  NONE  Complexity of physical exam: INCLUDING MUSCULOSKELETAL SYSTEM (POSTURE, ROM, STRENGTH, HEIGHT/WEIGHT),  INCLUDING NEUROMUSCULAR EXAM (BALANCE, GAIT, LOCOMOTION, MOBILITY), INCLUDING ACTIVITY/MOBILITY RESTRICTIONS, MD REFERRAL IS FOR MULTIPLE BODY PARTS, and REFERRAL IS FOR A CHRONIC PROBLEM   Clinical Presentation: STABLE   Evaluation Complexity: HIGH-HISTORY 3+, EXAMINATION 4+, PRESENTATION EVOLVING/UNSTABLE        Total Session Time 45, Timed code minutes 15, and Untimed code minutes 30        Intervention minutes: EVALUATION 30 minutes and THERAPEUTIC EXERCISE 15 minutes    Oliver Hum  03/20/2022, 1850              Start of Service: _________          Certification:    From:______  Through:_________    I certify the need for these services furnished under this plan of treatment and while under my care.    Referring Provider Signature: _______________     Date : _____________________

## 2022-03-22 ENCOUNTER — Ambulatory Visit (HOSPITAL_COMMUNITY)
Admission: RE | Admit: 2022-03-22 | Discharge: 2022-03-22 | Disposition: A | Discharge status: Still In Hospital | Payer: Worker's Comp, Other unspecified | Source: Ambulatory Visit | Attending: ORTHOPEDIC, SPORTS MEDICINE | Admitting: ORTHOPEDIC, SPORTS MEDICINE

## 2022-03-22 ENCOUNTER — Other Ambulatory Visit (HOSPITAL_COMMUNITY): Payer: Self-pay | Admitting: ORTHOPEDIC, SPORTS MEDICINE

## 2022-03-22 ENCOUNTER — Other Ambulatory Visit: Payer: Self-pay

## 2022-03-22 DIAGNOSIS — S93491D Sprain of other ligament of right ankle, subsequent encounter: Secondary | ICD-10-CM

## 2022-03-22 NOTE — Progress Notes (Signed)
[facility]  Outpatient Physical Therapy  [address]  [place], [address]  [phone]  (Fax) [phone]    Physical Therapy Treatment Note    Date: 03/22/2022  Patient's Name: Johnathan Washington  Date of Birth: October 13, 1970    Visit #/POC:2/36  Authorization:no  POC Signed?: no  POC Ends: 06/13/2022  Next Progress Note Due: 04/19/2022    Evaluating Physical Therapist: Oliver Hum, MSPT  PT diagnosis/Reason for Referral:   Cervical sprain  Thoraic sprain  Lumbar sprain  Next Scheduled Physician Appointment: 04/26/2022   Allergies/Contraindications: allegra, flu shot, and Bee venom    Subjective: Pt is doing well.  He tolerated all the exercises except standing marches which he thinks caused his back to spasm and had trouble with that exercises.      Objective: interventions in therapy today are listed in chart below:                  EXERCISE/ACTIVITY NAME REPETITIONS RESISTANCE COMPLETED THIS DOS   LAQ   3 x 10 No resistance Yes; HEP 03/20/2022   Hip abd   R x 30  L x 15 (stopped due to pain and therapist agreement) No resistance  Yes; HEP   03/20/2022   Hip extension   R x 23  L x 30 No resistance Yes; HEP 03/20/2022   Hip flexion    R x 20 (tingle into R leg)    No resistance Yes; HEP 03/20/2022   Mini squat   B x 20 No resistance Yes; HEP 03/20/2022   Hamstring stretch 1 foot up and 1 foot down   30 sec x 3 No resistance Yes; HEP 03/20/2022   Manual therapy at cervical, thoracic, and lumbar spine   STM of upper trap and c-spine, grade 3 mobs PA glides through T spine, DTM/trigger point of piriformis and gluteus maximus - total 15 min today N/A Yes; discussed pt getting a percussion therapy gun and using at home over piriformis muscle/glut and pt may bring it to therapy for MT to that area (sciatica treatment)                     Assessment: Pt tolerated treatment well.  As he does his exercises, certain exercises cause the R leg to spasm.  Pt was instructed to stop the exercise before the  point of muscle spasm and then try again later or the next day.  Progress slowly as pt tolerates without causing spasms. Pt will continue to benefit from skilled outpatient physical therapy services.      Plan: Continue with current POC.    Total Session Time 30 and Timed code minutes 15  THERAPEUTIC EXERCISE 30 minutes and JOINT MOBILIZATION/MFR 15 minutes      Oliver Hum  03/22/2022, 1400

## 2022-03-26 ENCOUNTER — Other Ambulatory Visit: Payer: Self-pay

## 2022-03-26 ENCOUNTER — Ambulatory Visit (HOSPITAL_COMMUNITY)
Admission: RE | Admit: 2022-03-26 | Discharge: 2022-03-26 | Disposition: A | Discharge status: Still In Hospital | Payer: Worker's Comp, Other unspecified | Source: Ambulatory Visit | Attending: ORTHOPEDIC, SPORTS MEDICINE | Admitting: ORTHOPEDIC, SPORTS MEDICINE

## 2022-03-26 ENCOUNTER — Ambulatory Visit (HOSPITAL_COMMUNITY): Payer: Self-pay

## 2022-03-26 NOTE — PT Treatment (Signed)
[facility]  Outpatient Physical Therapy  [address]  [place], [address]  [phone]  (Fax) [phone]    Physical Therapy Treatment Note    Date: 03/26/2022  Patient's Name: Johnathan Washington  Date of Birth: [birth year]      Visit #/POC:3/36  Authorization:no  POC Signed?: no  POC Ends: 06/13/2022  Next Progress Note Due: 04/19/2022     Evaluating Physical Therapist: Oliver Hum, MSPT  PT diagnosis/Reason for Referral:   Cervical sprain  Thoraic sprain  Lumbar sprain  Next Scheduled Physician Appointment: 04/26/2022   Allergies/Contraindications: allegra, flu shot, and Bee venom     Subjective: Patient states that he is having an increase in pain. Satte staht he has done his HEP randomly throughout the day doing several reps. Rates pain 2-3. Shooting burning to (R) side knee.      Objective: interventions in therapy today are listed in chart below:                  EXERCISE/ACTIVITY NAME REPETITIONS RESISTANCE COMPLETED THIS DOS   LAQ    3 x 10 No resistance n; HEP 03/20/2022   Hip abd    R x 30  L x 15 (stopped due to pain and therapist agreement) No resistance  n; HEP   03/20/2022   Hip extension    R x 23  L x 30 No resistance n; HEP 03/20/2022   Hip flexion     R x 20 (tingle into R leg)     No resistance n HEP 03/20/2022   Mini squat    B x 20 No resistance n; HEP 03/20/2022   Hamstring stretch 1 foot up and 1 foot down    30 sec x 3 No resistance n; HEP 03/20/2022   Manual therapy at cervical, thoracic, and lumbar spine    STM of upper trap and c-spine, grade 3 mobs PA glides through T spine, DTM/trigger point of piriformis and gluteus maximus - total 15 min today manual Yes       standing lumbar ext   x10    y    Patient ed with HEP reps         y      Assessment: Tolerated well. Noted relaxation of muscle tone following MFR. Noted VC with lumbar extension with proper technique.     Short Term Goals: 6 Weeks              -Pt will decrease pain in his cervical spine so that he  reports that it is a 0/10 50% of the time.              -Pt will decrease pain in his thoracic spine so that he reports that it is a 0/10 50% of the time.              -Pt will decrease pain in his lumbar spine to intermittent and report no radiculopathy into his legs more than 4/7 days a week.               -Pt will improve his PSFS (functionals skills) to an average of 3 or better.              -Pt will improve his forward trunk flexion to 20 cm to the floor or less.              -Pt will improve his side flexion bilaterally to 35 cm to  the floor or less.              -Pt will improve all LE/trunk/cervical MMT to 4/5.         Long Term Goals: 12  Weeks              -Pt will decrease pain in his cervical spine so that he reports that it is a 0/10 75% of the time.              -Pt will decrease pain in his thoracic spine so that he reports that it is a 0/10 75% of the time.              -Pt will decrease pain in his lumbar spine to intermittent and report no radiculopathy into his legs more than 2/7 days a week.               -Pt will improve his PSFS (functionals skills) to an average of 6 or better.              -Pt will improve his forward trunk flexion to 15 cm to the floor or less.              -Pt will improve his side flexion bilaterally to 30 cm to the floor or less.              -Pt will improve all LE/trunk/cervical MMT to 5/5.   Plan: Continue with current POC.      Total Session Time 38 and Timed code minutes 38  THERAPEUTIC EXERCISE 15 minutes and JOINT MOBILIZATION/MFR 23 minutes      Trilby Leaver, PTA  03/26/2022, 16:57

## 2022-03-28 ENCOUNTER — Ambulatory Visit (HOSPITAL_COMMUNITY)
Admission: RE | Admit: 2022-03-28 | Discharge: 2022-03-28 | Disposition: A | Discharge status: Still In Hospital | Payer: Worker's Comp, Other unspecified | Source: Ambulatory Visit | Attending: ORTHOPEDIC, SPORTS MEDICINE | Admitting: ORTHOPEDIC, SPORTS MEDICINE

## 2022-03-28 ENCOUNTER — Other Ambulatory Visit: Payer: Self-pay

## 2022-03-28 NOTE — Progress Notes (Signed)
[facility]  Outpatient Physical Therapy  [address]  [place], [address]  [phone]  (Fax) [phone]    Physical Therapy Treatment Note    Date: 03/28/2022  Patient's Name: Johnathan Washington  Date of Birth: [birth year]    Visit #/POC:4/36  Authorization:none given  POC Signed?: yes  POC Ends: 06/12/2022  Next Progress Note Due: 04/19/2022     Evaluating Physical Therapist: Oliver Hum, MSPT  PT diagnosis/Reason for Referral:   Lumbar sprain - current treatment for this POC    Orders and PT diagnosis/Reason for Referral also include:   Cervical sprain (not treating now) - Script written 02/13/2022  Thoraic sprain (not treating now) - script written 02/13/2022  R ankle sprain S93.491D (Dr. Cleophas Dunker - not treating yet) - script written 03/05/2022  Next Scheduled Physician Appointment: 03/28/2022 appointment with Dr. Allena Katz for back, thoracic, and neck pain.  Dr. Allena Katz is ordering new MRI of lumbar spine.  Dr. Allena Katz would like PT to do recommendations/prescriptions for aquatics and medical massage. PT talked to pt about PT not being the person writes prescriptions but that Oliver Hum, will talk to manager to see if we can do medical massage here at [facility] and how to get him referred to aquatic therapy.  PT will also talk to manager about incorporating ankle treatment into the current POC and if this is a possibility.  Prescriptions and recommendations need to be sent to the pt's case manager.  Pt sees Dr. Cleophas Dunker for a follow up for the R knee and R foot on 04/11/2022.   Pt reported that Doctor Cleophas Dunker wanted pt to participate in PT for his ankle and knee before the follow up appointment with him on 04/11/2022 to determine if the R ankle proprioception would return with therapy.   Allergies/Contraindications: allegra, flu shot, and Bee venom     Subjective: Pt reports that the standing back extension exercises are helping him.  If he does too much exercise, he "over does it", his  pain is worse.  Pt has been educated on how not to over do it and the frequency at which to do his HEP.   Today his pain 1/10      Objective: interventions in therapy today are listed in chart below:                  EXERCISE/ACTIVITY NAME REPETITIONS RESISTANCE COMPLETED THIS DOS   LAQ    3 x 10 No resistance Yes; HEP 03/20/2022   Hip abd    R x 30  L x 15 (stopped due to pain and therapist agreement) No resistance  n; HEP   03/20/2022   Hip extension    R x 23  L x 30 No resistance n; HEP 03/20/2022   Hip flexion     R x 20 (tingle into R leg)     No resistance n HEP 03/20/2022   Mini squat    B x 20 No resistance n; HEP 03/20/2022   Hamstring stretch 1 foot up and 1 foot down    30 sec x 3 No resistance n; HEP 03/20/2022   Manual therapy at cervical, thoracic, and lumbar spine    15 min over the R hip, It band, piriformis, sciatic nerve, and surrounding muscles manual Yes       standing lumbar ext   x10    n   Short sitting lumbar extension over therapy ball 2 x 10  Yes; HEP  03/28/2022 to use with standing or in place of standing extension (either 2 x 10 reps multiple times a day)     Patient ed with HEP reps         n      Assessment: Tolerated well. Noted relaxation of muscle tone following percussion with therapy gun. Noted VC with lumbar extension with proper technique.     Short Term Goals: 6 Weeks              -Pt will decrease pain in his cervical spine so that he reports that it is a 0/10 50% of the time.              -Pt will decrease pain in his thoracic spine so that he reports that it is a 0/10 50% of the time.              -Pt will decrease pain in his lumbar spine to intermittent and report no radiculopathy into his legs more than 4/7 days a week.               -Pt will improve his PSFS (functionals skills) to an average of 3 or better.              -Pt will improve his forward trunk flexion to 20 cm to the floor or less.              -Pt will improve his side flexion bilaterally to 35 cm to the floor or less.               -Pt will improve all LE/trunk/cervical MMT to 4/5.         Long Term Goals: 12  Weeks              -Pt will decrease pain in his cervical spine so that he reports that it is a 0/10 75% of the time.              -Pt will decrease pain in his thoracic spine so that he reports that it is a 0/10 75% of the time.              -Pt will decrease pain in his lumbar spine to intermittent and report no radiculopathy into his legs more than 2/7 days a week.               -Pt will improve his PSFS (functionals skills) to an average of 6 or better.              -Pt will improve his forward trunk flexion to 15 cm to the floor or less.              -Pt will improve his side flexion bilaterally to 30 cm to the floor or less.              -Pt will improve all LE/trunk/cervical MMT to 5/5.     Plan: Continue with current POC.    Total Session Time 45 and Timed code minutes 45  THERAPEUTIC EXERCISE 30 minutes and JOINT MOBILIZATION/MFR 15 minutes      Oliver Hum  03/28/2022, 1710

## 2022-04-03 ENCOUNTER — Ambulatory Visit (HOSPITAL_COMMUNITY)
Admission: RE | Admit: 2022-04-03 | Discharge: 2022-04-03 | Disposition: A | Discharge status: Still In Hospital | Payer: Worker's Comp, Other unspecified | Source: Ambulatory Visit | Attending: ORTHOPEDIC, SPORTS MEDICINE | Admitting: ORTHOPEDIC, SPORTS MEDICINE

## 2022-04-03 ENCOUNTER — Other Ambulatory Visit: Payer: Self-pay

## 2022-04-03 NOTE — Progress Notes (Addendum)
[facility]  Outpatient Physical Therapy  [place], [address]  ([phone]  [phone]    Physical Therapy Treatment Note  PROGRESS NOTE/Ankle Assessment    Date: 04/03/2022  Patient's Name: Johnathan Washington  Date of Birth: October 14, 1970      Visit #/POC:4/36  Authorization:none given  POC Signed?: yes  POC Ends: 06/12/2022  Next Progress Note Due: 05/03/2022     Evaluating Physical Therapist: Darrol Jump, MSPT  PT diagnosis/Reason for Referral:   Lumbar sprain - current treatment for this POC     Orders and PT diagnosis/Reason for Referral also include:   Cervical sprain (not treating now) - Script written 02/13/2022  Thoraic sprain (not treating now) - script written 02/13/2022  R ankle sprain S93.491D (Dr. Durward Fortes - not treating yet) - script written 03/05/2022    NOTE - This progress note is to add assessment of ankle and add ankle goals to POC/treatment.    Next Scheduled Physician Appointment: 03/28/2022 appointment with Dr. Posey Pronto for back, thoracic, and neck pain.  Dr. Posey Pronto is ordering new MRI of lumbar spine.  Pt sees Dr. Durward Fortes for a follow up for the R knee and R foot on 04/11/2022.   Pt reported that Doctor Durward Fortes wanted pt to participate in PT for his ankle and knee before the follow up appointment with him on 04/11/2022 to determine if the R ankle proprioception would return with therapy.   Allergies/Contraindications: allegra, flu shot, and Bee venom     Subjective: Pt reports that the standing back extension exercises are helping him.    Ankle - pt reports extremely deminished ankle proprioception on the R ankle.  He is always rolling his ankle (supination) and falling.  He fell in July 2023 and sustained a severe peroneal strain.  He was in a walking boot for 5 weeks following the ankle strain and now is using a lace up ankle brace.  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     Ankle Pain  R ankle = intermittent pain at worse = 4/10  Knee Pain   R knee = achy, intermittent    Strength  Ankle PF = L with UE support in SLS x 30 ankle PF; R with UE support in SLS x 7 (then gets shooting pain in the R ankle upward into leg)   Ankle DF L=5/5 R=3/5   Ankle Inver L=5/5 R=3+/5  Ankle Ever L=5/5 R 3+/5    Crepitus  R ankle during active resisted DF and PF against yellow t-band approx 3x in 10 reps each direction    Balance  SLS L= 18 sec and R = 5 sec with significant instability    Ankle AROM  R DF = 3 degrees  R PF = 40 degrees  R Inver = 30 degrees  R Ever = 5 degrees      Objective: interventions in therapy today are listed in chart below:                  EXERCISE/ACTIVITY NAME REPETITIONS RESISTANCE COMPLETED THIS DOS   LAQ    3 x 10 No resistance Yes; HEP 03/20/2022   Hip abd    R x 30  L x 15 (stopped due to pain and therapist agreement) No resistance  n; HEP   03/20/2022  Hip extension    R x 23  L x 30 No resistance n; HEP 03/20/2022   Hip flexion     R x 20 (tingle into R leg)     No resistance n HEP 03/20/2022   Mini squat    B x 20 No resistance n; HEP 03/20/2022   Hamstring stretch 1 foot up and 1 foot down    30 sec x 3 No resistance n; HEP 03/20/2022   Manual therapy at cervical, thoracic, and lumbar spine    15 min over the R hip, It band, piriformis, sciatic nerve, and surrounding muscles manual Yes       standing lumbar ext   x10    n   Short sitting lumbar extension over therapy ball 2 x 10   Yes; HEP 03/28/2022 to use with standing or in place of standing extension (either 2 x 10 reps multiple times a day)     Patient ed with HEP reps         n   Ankle proprioception on airex mat 10-20 sec holds x 10  yes   Ankle strengthening PF, DF, Inv, Ever x 10 each  Yellow t-band  yes      HEP:  Access Code: [email or web address]  URL: [email or web address]  Date: 04/03/2022  Prepared by: Darrol Jump    Exercises  - Ankle Dorsiflexion  with Resistance  - 1 x daily - 7 x weekly - 1 sets - 10 reps  - Ankle Eversion with Resistance  - 1 x daily - 7 x weekly - 1 sets - 10 reps  - Ankle Inversion with Resistance  - 1 x daily - 7 x weekly - 1 sets - 10 reps  - Ankle and Toe Plantarflexion with Resistance  - 1 x daily - 7 x weekly - 1 sets - 10 reps      Assessment: PROGRESS NOTE - Johnathan Washington has attended 4/26 visits per POC.  He has made significant progress with a HEP, pain management for his LBP, and strengthening his LE.  Pt sustained an injury when he fell in 2022 to his cervical spine, thoracic spine, lumbar spine, R hip, R knee, and R ankle.  We have started his POC to address his LBP.  This progress note is to update his POC to add proprioceptive training for hie R ankle, R ankle goals, and update his goals for his LBP.  Pt has met 0/7 STGs (but is making significant progress for 4 sessions of therapy) for his back pain and 0/7 LTG for his back pain.  NEW ANKLE GOALS WERE SET THIS SESSION - SEE BELOW UNDER GOALS.  Pt continues to present with deficits in spine AROM, LE strength, ankle instability, balance, and pain management.  Pt will continue to benefit from skilled outpatient physical therapy to address his deficits.      Dr. Posey Pronto would like pt to participate in aquatics and medical massage. PT is in agreement with this treatment regimen.  However, pt is currently limited to the amount of visits he has in outpatient physical therapy - massage here at this clinic would take up time that is needed to implement strengthening, stretching, proprioceptive treatments, balance, gait training, and pain management interventions that we can provide.  PT would recommend medical massage from a massage clinic that specializes in this to be added in addition to the current outpatient physical therapy services he is receiving.  There is no aquatic therapy offered at this  facility.  PT would also recommend aquatic therapy in addition to the current  outpatient physical therapy pt is currently receiving at this clinic.  PT at this clinic is unable to write prescriptions/referrals for aquatics or medical massage and those prescriptions will need to be written by his doctor.       Short Term Goals: 6 Weeks              -BACK - Pt will decrease pain in his cervical spine so that he reports that it is a 0/10 50% of the time.  PROGRESSING, 04/03/2022              -BACK - Pt will decrease pain in his thoracic spine so that he reports that it is a 0/10 50% of the time. PROGRESSING, 04/03/2022              -BACK - Pt will decrease pain in his lumbar spine to intermittent and report no radiculopathy into his legs more than 4/7 days a week.  PROGRESSING, 04/03/2022              -BACK - Pt will improve his PSFS (functionals skills) to an average of 3 or better. PROGRESSING, 04/03/2022              -BACK - Pt will improve his forward trunk flexion to 20 cm to the floor or less.  PROGRESSING, 04/03/2022              -BACK - Pt will improve his side flexion bilaterally to 35 cm to the floor or less. PROGRESSING, 04/03/2022              -BACK - Pt will improve all LE/trunk/cervical MMT to 4/5.  PROGRESSING, 04/03/2022   -ANKLE - Pt will improve his ankle strength and balance so that he can stand on his R ankle for 10 sec without radiculopathy spreading up his leg. NEW, 04/03/2022   -ANKLE - Pt will improve his R ankle strength to 4/5 in all directions no more than 3 "pops" (crepitus) per 10 reps each direction.  NEW, 04/03/2022   -ANKLE - Pt will improve his R ankle eversion to 10 degrees of AROM. NEW, 04/03/2022          Long Term Goals: 12  Weeks - ALL LTG ARE NOT MET AS OF 04/03/2022              -BACK - Pt will decrease pain in his cervical spine so that he reports that it is a 0/10 75% of the time.                -BACK - Pt will decrease pain in his thoracic spine so that he reports that it is a 0/10 75% of the time.              -BACK - Pt will decrease pain in his lumbar spine to  intermittent and report no radiculopathy into his legs more than 2/7 days a week.               -BACK - Pt will improve his PSFS (functionals skills) to an average of 6 or better.              -BACK - Pt will improve his forward trunk flexion to 15 cm to the floor or less.              -BACK - Pt will improve his side flexion bilaterally  to 30 cm to the floor or less.              -BACK - Pt will improve all LE/trunk/cervical MMT to 5/5.    -ANKLE - Pt will improve his ankle strength and balance so that he can stand on his R ankle for 20 sec without radiculopathy spreading up his leg. NEW, 04/03/2022   -ANKLE - Pt will improve his R ankle strength to 4+/5 in all directions no more than 3 "pops" (crepitus) per 10 reps each direction.  NEW, 04/03/2022   -ANKLE - Pt will be able to do forward step ups,, lateral steps ups, and step overs on the BOSU ball x 10 each direction per therapy session without his R ankle giving way or causing an increase in pain.  NEW, 04/03/2022     Plan: New POC is to address his LBP for 20 min of his scheduled sessions and then address his R ankle proprioception/strengthening for the remainder of the session.  New goals to address his ankle have been added to the POC.  Continue with goals for his LBP.       Total Session Time 45 and Timed code minutes 45  THERAPEUTIC EXERCISE 30 minutes and NEURO RE-EDUCATION 15MINUTES      Darrol Jump  04/04/2022, [phone]    Start of Service:     Re-certification:           From:    Through:       I have reviewed this plan of treatment and re-certify a continuing need for service.    Referring Provider Signature:       Date:        Treatment Diagnosis:

## 2022-04-05 ENCOUNTER — Ambulatory Visit (HOSPITAL_COMMUNITY)
Admission: RE | Admit: 2022-04-05 | Discharge: 2022-04-05 | Disposition: A | Discharge status: Still In Hospital | Payer: Worker's Comp, Other unspecified | Source: Ambulatory Visit | Attending: ORTHOPEDIC, SPORTS MEDICINE | Admitting: ORTHOPEDIC, SPORTS MEDICINE

## 2022-04-05 ENCOUNTER — Other Ambulatory Visit: Payer: Self-pay

## 2022-04-05 NOTE — PT Treatment (Addendum)
[facility]  Outpatient Physical Therapy  [place], [address]  [phone]  [phone]    Physical Therapy Treatment Note    Date: 04/05/2022  Patient's Name: Johnathan Washington  Date of Birth: [birth year]        Visit #/POC:6/36  Authorization:none given  POC Signed?: yes  POC Ends: 06/12/2022  Next Progress Note Due: 05/03/2022     Evaluating Physical Therapist: Darrol Jump, MSPT  PT diagnosis/Reason for Referral:   Lumbar sprain - current treatment for this POC     Orders and PT diagnosis/Reason for Referral also include:   Cervical sprain (not treating now) - Script written 02/13/2022  Thoraic sprain (not treating now) - script written 02/13/2022  R ankle sprain S93.491D (Dr. Durward Fortes - not treating yet) - script written 03/05/2022   Next Scheduled Physician Appointment: 03/28/2022 appointment with Dr. Posey Pronto for back, thoracic, and neck pain.  Dr. Posey Pronto is ordering new MRI of lumbar spine.  Pt sees Dr. Durward Fortes for a follow up for the R knee and R foot on 04/11/2022.   Pt reported that Doctor Durward Fortes wanted pt to participate in PT for his ankle and knee before the follow up appointment with him on 04/11/2022 to determine if the R ankle proprioception would return with therapy.   Allergies/Contraindications: allegra, flu shot, and Bee venom     Subjective: Pt reports that he has been compliant with HEP. States that he has difficulty with his proprioception. States the lumbar extension has really helped his back pain.     Objective: interventions in therapy today are listed in chart below:                  EXERCISE/ACTIVITY NAME REPETITIONS RESISTANCE COMPLETED THIS DOS   LAQ    3 x 10 No resistance n; HEP 03/20/2022   Hip abd    R x 30  L x 15 (stopped due to pain and therapist agreement) No resistance  n; HEP   03/20/2022   Hip extension    R x 23  L x 30 No resistance n; HEP 03/20/2022   Hip flexion     R x 20 (tingle into R leg)     No resistance n HEP 03/20/2022    Mini squat    B x 20 No resistance n; HEP 03/20/2022   Hamstring stretch 1 foot up and 1 foot down    30 sec x 3 No resistance n; HEP 03/20/2022   Manual therapy at cervical, thoracic, and lumbar spine    15 min over the R hip, It band, piriformis, sciatic nerve, and surrounding muscles manual Yes       standing lumbar ext   x10    n   Short sitting lumbar extension over therapy ball 2 x 10   n; HEP 03/28/2022 to use with standing or in place of standing extension (either 2 x 10 reps multiple times a day)     Patient ed with HEP reps         n   Ankle proprioception on airex mat SLS 5 sec holds x 5   yes   Ankle strengthening PF, DF, Inv, Ever x 10 each  Yellow t-band  N    BOSU step ups fwd x10  yes   Balance machine   LOS  Random control  Maze control     X3  X3  x2     Level 11  Level 11  Level 10     Yes  Yes  yes   Bird dog  x10  yes      Assessment: Noted patient's ankle fatigues quickly with balance machine. Noted he did require visual cue with mirror when performing step ups on BOSU. Noted cavitation in lower thoracic even with minimal pressure. Tactile cueing with bird dog to prevent compensatory movements.        Short Term Goals: 6 Weeks              -BACK - Pt will decrease pain in his cervical spine so that he reports that it is a 0/10 50% of the time.  PROGRESSING, 04/03/2022              -BACK - Pt will decrease pain in his thoracic spine so that he reports that it is a 0/10 50% of the time. PROGRESSING, 04/03/2022              -BACK - Pt will decrease pain in his lumbar spine to intermittent and report no radiculopathy into his legs more than 4/7 days a week.  PROGRESSING, 04/03/2022              -BACK - Pt will improve his PSFS (functionals skills) to an average of 3 or better. PROGRESSING, 04/03/2022              -BACK - Pt will improve his forward trunk flexion to 20 cm to the floor or less.  PROGRESSING, 04/03/2022              -BACK - Pt will improve his side flexion bilaterally to 35 cm to the floor or  less. PROGRESSING, 04/03/2022              -BACK - Pt will improve all LE/trunk/cervical MMT to 4/5.  PROGRESSING, 04/03/2022              -ANKLE - Pt will improve his ankle strength and balance so that he can stand on his R ankle for 10 sec without radiculopathy spreading up his leg. NEW, 04/03/2022              -ANKLE - Pt will improve his R ankle strength to 4/5 in all directions no more than 3 "pops" (crepitus) per 10 reps each direction.  NEW, 04/03/2022              -ANKLE - Pt will improve his R ankle eversion to 10 degrees of AROM. NEW, 04/03/2022           Long Term Goals: 12  Weeks - ALL LTG ARE NOT MET AS OF 04/03/2022              -BACK - Pt will decrease pain in his cervical spine so that he reports that it is a 0/10 75% of the time.                -BACK - Pt will decrease pain in his thoracic spine so that he reports that it is a 0/10 75% of the time.              -BACK - Pt will decrease pain in his lumbar spine to intermittent and report no radiculopathy into his legs more than 2/7 days a week.               -BACK - Pt will improve his PSFS (functionals skills) to an average of 6 or  better.              -BACK - Pt will improve his forward trunk flexion to 15 cm to the floor or less.              -BACK - Pt will improve his side flexion bilaterally to 30 cm to the floor or less.              -BACK - Pt will improve all LE/trunk/cervical MMT to 5/5.               -ANKLE - Pt will improve his ankle strength and balance so that he can stand on his R ankle for 20 sec without radiculopathy spreading up his leg. NEW, 04/03/2022              -ANKLE - Pt will improve his R ankle strength to 4+/5 in all directions no more than 3 "pops" (crepitus) per 10 reps each direction.  NEW, 04/03/2022              -ANKLE - Pt will be able to do forward step ups,, lateral steps ups, and step overs on the BOSU ball x 10 each direction per therapy session without his R ankle giving way or causing an increase in pain.  NEW,  04/03/2022     Plan: Continue following current POC.  New goals to address his ankle have been added to the POC.  Continue with goals for his LBP.                  Total Session Time 40 and Timed code minutes 40  THERAPEUTIC EXERCISE 30 minutes and NEURO RE-EDUCATION 10MINUTES      Carlton Adam, PTA  04/05/2022, 07:58

## 2022-04-09 ENCOUNTER — Other Ambulatory Visit: Payer: Self-pay

## 2022-04-09 ENCOUNTER — Ambulatory Visit (HOSPITAL_COMMUNITY)
Admission: RE | Admit: 2022-04-09 | Discharge: 2022-04-09 | Disposition: A | Discharge status: Still In Hospital | Payer: Worker's Comp, Other unspecified | Source: Ambulatory Visit | Attending: ORTHOPEDIC, SPORTS MEDICINE | Admitting: ORTHOPEDIC, SPORTS MEDICINE

## 2022-04-09 NOTE — PT Treatment (Signed)
[facility]  Outpatient Physical Therapy  [place], [address]  [phone]  [phone]    Physical Therapy Treatment Note    Date: 04/09/2022  Patient's Name: Johnathan Washington  Date of Birth: Oct 08, 1970      Visit #/POC:7/36  Authorization:none given  POC Signed?: yes  POC Ends: 06/12/2022  Next Progress Note Due: 05/03/2022     Evaluating Physical Therapist: Darrol Jump, MSPT  PT diagnosis/Reason for Referral:   Lumbar sprain - current treatment for this POC     Orders and PT diagnosis/Reason for Referral also include:   Cervical sprain (not treating now) - Script written 02/13/2022  Thoraic sprain (not treating now) - script written 02/13/2022  R ankle sprain S93.491D (Dr. Durward Fortes - not treating yet) - script written 03/05/2022   Next Scheduled Physician Appointment: 03/28/2022 appointment with Dr. Posey Pronto for back, thoracic, and neck pain.  Dr. Posey Pronto is ordering new MRI of lumbar spine.  Pt sees Dr. Durward Fortes for a follow up for the R knee and R foot on 04/11/2022.   Pt reported that Doctor Durward Fortes wanted pt to participate in PT for his ankle and knee before the follow up appointment with him on 04/11/2022 to determine if the R ankle proprioception would return with therapy.   Allergies/Contraindications: allegra, flu shot, and Bee venom     Subjective: Pt reports that he wears his braces while working, but when he comes to PT he takes them off. States that his radicular symptoms are staying mainly in his hip.     Objective: interventions in therapy today are listed in chart below:                  EXERCISE/ACTIVITY NAME REPETITIONS RESISTANCE COMPLETED THIS DOS   LAQ    3 x 10 No resistance n; HEP 03/20/2022   Hip abd    R x 30  L x 15 (stopped due to pain and therapist agreement) No resistance  n; HEP   03/20/2022   Hip extension    R x 23  L x 30 No resistance n; HEP 03/20/2022   Hip flexion     R x 20 (tingle into R leg)     No resistance n HEP 03/20/2022   Mini  squat    B x 20 No resistance n; HEP 03/20/2022   Hamstring stretch 1 foot up and 1 foot down    30 sec x 3 No resistance n; HEP 03/20/2022   Manual therapy at cervical, thoracic, and lumbar spine    15 min over the R hip, It band, piriformis, sciatic nerve, and surrounding muscles manual n       standing lumbar ext   x10    n   Short sitting lumbar extension over therapy ball 2 x 10   n; HEP 03/28/2022 to use with standing or in place of standing extension (either 2 x 10 reps multiple times a day)     Patient ed with HEP reps         y   Ankle proprioception on airex mat SLS 5 sec holds x 5   no   Ankle strengthening PF, DF, Inv, Ever x 10 each  Yellow t-band  N    BOSU step ups fwd x10   no   Balance machine   LOS  Random control  Maze control       X3  X3  x2  Level 11  Level 11  Level 10       Yes  Yes  yes   Bird dog  x10   yes   superman x5  yes   Hip abd prone   Yes    Heel squeeze    yes   HEP review/update   yes     Access Code: [email or web address]  URL: [email or web address]  Date: 04/09/2022  Prepared by: Carlton Adam    Exercises  - Supine Piriformis Stretch  - 1 x daily - 7 x weekly - 3 sets - 5 reps - 1115 sec hold  - Seated Piriformis Stretch  - 1 x daily - 7 x weekly - 3 sets - 5 reps - 15 sec hold    Assessment: Noted compensatory movements with balance machine. Tactile cueing with bird dog to prevent compensatory movements.         Short Term Goals: 6 Weeks              -BACK - Pt will decrease pain in his cervical spine so that he reports that it is a 0/10 50% of the time.  PROGRESSING, 04/03/2022              -BACK - Pt will decrease pain in his thoracic spine so that he reports that it is a 0/10 50% of the time. PROGRESSING, 04/03/2022              -BACK - Pt will decrease pain in his lumbar spine to intermittent and report no radiculopathy into his legs more than 4/7 days a week.  PROGRESSING, 04/03/2022              -BACK - Pt will improve his PSFS (functionals skills) to an average of 3 or better.  PROGRESSING, 04/03/2022              -BACK - Pt will improve his forward trunk flexion to 20 cm to the floor or less.  PROGRESSING, 04/03/2022              -BACK - Pt will improve his side flexion bilaterally to 35 cm to the floor or less. PROGRESSING, 04/03/2022              -BACK - Pt will improve all LE/trunk/cervical MMT to 4/5.  PROGRESSING, 04/03/2022              -ANKLE - Pt will improve his ankle strength and balance so that he can stand on his R ankle for 10 sec without radiculopathy spreading up his leg. NEW, 04/03/2022              -ANKLE - Pt will improve his R ankle strength to 4/5 in all directions no more than 3 "pops" (crepitus) per 10 reps each direction.  NEW, 04/03/2022              -ANKLE - Pt will improve his R ankle eversion to 10 degrees of AROM. NEW, 04/03/2022           Long Term Goals: 12  Weeks - ALL LTG ARE NOT MET AS OF 04/03/2022              -BACK - Pt will decrease pain in his cervical spine so that he reports that it is a 0/10 75% of the time.                -BACK - Pt will decrease pain in his thoracic spine  so that he reports that it is a 0/10 75% of the time.              -BACK - Pt will decrease pain in his lumbar spine to intermittent and report no radiculopathy into his legs more than 2/7 days a week.               -BACK - Pt will improve his PSFS (functionals skills) to an average of 6 or better.              -BACK - Pt will improve his forward trunk flexion to 15 cm to the floor or less.              -BACK - Pt will improve his side flexion bilaterally to 30 cm to the floor or less.              -BACK - Pt will improve all LE/trunk/cervical MMT to 5/5.               -ANKLE - Pt will improve his ankle strength and balance so that he can stand on his R ankle for 20 sec without radiculopathy spreading up his leg. NEW, 04/03/2022              -ANKLE - Pt will improve his R ankle strength to 4+/5 in all directions no more than 3 "pops" (crepitus) per 10 reps each direction.  NEW,  04/03/2022              -ANKLE - Pt will be able to do forward step ups,, lateral steps ups, and step overs on the BOSU ball x 10 each direction per therapy session without his R ankle giving way or causing an increase in pain.  NEW, 04/03/2022     Plan: Continue following current POC.  New goals to address his ankle have been added to the POC.  Continue with goals for his LBP.               Total Session Time 38 and Timed code minutes 38  THERAPEUTIC EXERCISE 38 minutes      Carlton Adam, PTA  04/09/2022, 17:01

## 2022-04-12 ENCOUNTER — Ambulatory Visit (HOSPITAL_COMMUNITY)
Admission: RE | Admit: 2022-04-12 | Discharge: 2022-04-12 | Disposition: A | Discharge status: Still In Hospital | Payer: Worker's Comp, Other unspecified | Source: Ambulatory Visit | Attending: ORTHOPEDIC, SPORTS MEDICINE | Admitting: ORTHOPEDIC, SPORTS MEDICINE

## 2022-04-12 ENCOUNTER — Other Ambulatory Visit: Payer: Self-pay

## 2022-04-12 NOTE — PT Treatment (Signed)
[facility]  Outpatient Physical Therapy  [place], [address]  [phone]  [phone]    Physical Therapy Treatment Note    Date: 04/12/2022  Patient's Name: Johnathan Washington  Date of Birth: 10/10/1970      Visit #/POC:8/36  Authorization:none given  POC Signed?: yes  POC Ends: 06/12/2022  Next Progress Note Due: 05/03/2022     Evaluating Physical Therapist: Darrol Jump, MSPT  PT diagnosis/Reason for Referral:   Lumbar sprain - current treatment for this POC     Orders and PT diagnosis/Reason for Referral also include:   Cervical sprain (not treating now) - Script written 02/13/2022  Thoraic sprain (not treating now) - script written 02/13/2022  R ankle sprain S93.491D (Dr. Durward Fortes - not treating yet) - script written 03/05/2022   Next Scheduled Physician Appointment: 03/28/2022 appointment with Dr. Posey Pronto for back, thoracic, and neck pain.  Dr. Posey Pronto is ordering new MRI of lumbar spine.  Pt sees Dr. Durward Fortes for a follow up for the R knee and R foot on 04/11/2022.   Pt reported that Doctor Durward Fortes wanted pt to participate in PT for his ankle and knee before the follow up appointment with him on 04/11/2022 to determine if the R ankle proprioception would return with therapy.   Allergies/Contraindications: allegra, flu shot, and Bee venom     Subjective: Pt reports that he went to the ortho and he wants him to be out of the knee and ankle brace in 6 weeks(next visit). Instructed him to take knee brace off starting today at work and then wear the ankle brace x4 hours then remove it.States he di this today and his ankle is very tired and feels unsteady.      Objective: interventions in therapy today are listed in chart below:                  EXERCISE/ACTIVITY NAME REPETITIONS RESISTANCE COMPLETED THIS DOS   LAQ    3 x 10 No resistance n; HEP 03/20/2022   Hip abd    R x 30  L x 15 (stopped due to pain and therapist agreement) No resistance  n; HEP   03/20/2022   Hip  extension    R x 23  L x 30 No resistance n; HEP 03/20/2022   Hip flexion     R x 20 (tingle into R leg)     No resistance n HEP 03/20/2022   Mini squat    B x 20 No resistance n; HEP 03/20/2022   Hamstring stretch 1 foot up and 1 foot down    30 sec x 3 No resistance n; HEP 03/20/2022   Manual therapy at cervical, thoracic, and lumbar spine    15 min over the R hip, It band, piriformis, sciatic nerve, and surrounding muscles manual n       standing lumbar ext   x10    n   Short sitting lumbar extension over therapy ball 2 x 10   n; HEP 03/28/2022 to use with standing or in place of standing extension (either 2 x 10 reps multiple times a day)     Patient ed with HEP reps         n   Ankle proprioception on airex mat SLS 5 sec holds x 5   yes   Ankle strengthening PF, DF, Inv, Ever x 10 each  Yellow t-band  N    BOSU step ups  fwd x10   no   Balance machine   LOS  Random control  Maze control       X3  X3  x2       Level 11  Level 11  Level 10       Yes  Yes  yes   Bird dog  x10   n   superman x5   n   Hip abd prone     n   Heel squeeze      n   HEP review/update     n   Dyna disc fwd/backwards weight shift  Ball toss rebounder          2# ball Y        y   Toe raises with ball between heels  2# y                 Assessment: Noted patient did have more difficulty today with LOS on balance machine compared to prior visit. Noted on dyna disc he was unable to perform fwd stepping due to decrease proprioception and ankle strength.        Short Term Goals: 6 Weeks              -BACK - Pt will decrease pain in his cervical spine so that he reports that it is a 0/10 50% of the time.  PROGRESSING, 04/03/2022              -BACK - Pt will decrease pain in his thoracic spine so that he reports that it is a 0/10 50% of the time. PROGRESSING, 04/03/2022              -BACK - Pt will decrease pain in his lumbar spine to intermittent and report no radiculopathy into his legs more than 4/7 days a week.  PROGRESSING, 04/03/2022              -BACK  - Pt will improve his PSFS (functionals skills) to an average of 3 or better. PROGRESSING, 04/03/2022              -BACK - Pt will improve his forward trunk flexion to 20 cm to the floor or less.  PROGRESSING, 04/03/2022              -BACK - Pt will improve his side flexion bilaterally to 35 cm to the floor or less. PROGRESSING, 04/03/2022              -BACK - Pt will improve all LE/trunk/cervical MMT to 4/5.  PROGRESSING, 04/03/2022              -ANKLE - Pt will improve his ankle strength and balance so that he can stand on his R ankle for 10 sec without radiculopathy spreading up his leg. NEW, 04/03/2022              -ANKLE - Pt will improve his R ankle strength to 4/5 in all directions no more than 3 "pops" (crepitus) per 10 reps each direction.  NEW, 04/03/2022              -ANKLE - Pt will improve his R ankle eversion to 10 degrees of AROM. NEW, 04/03/2022           Long Term Goals: 12  Weeks - ALL LTG ARE NOT MET AS OF 04/03/2022              -BACK - Pt will  decrease pain in his cervical spine so that he reports that it is a 0/10 75% of the time.                -BACK - Pt will decrease pain in his thoracic spine so that he reports that it is a 0/10 75% of the time.              -BACK - Pt will decrease pain in his lumbar spine to intermittent and report no radiculopathy into his legs more than 2/7 days a week.               -BACK - Pt will improve his PSFS (functionals skills) to an average of 6 or better.              -BACK - Pt will improve his forward trunk flexion to 15 cm to the floor or less.              -BACK - Pt will improve his side flexion bilaterally to 30 cm to the floor or less.              -BACK - Pt will improve all LE/trunk/cervical MMT to 5/5.               -ANKLE - Pt will improve his ankle strength and balance so that he can stand on his R ankle for 20 sec without radiculopathy spreading up his leg. NEW, 04/03/2022              -ANKLE - Pt will improve his R ankle strength to 4+/5 in all  directions no more than 3 "pops" (crepitus) per 10 reps each direction.  NEW, 04/03/2022              -ANKLE - Pt will be able to do forward step ups,, lateral steps ups, and step overs on the BOSU ball x 10 each direction per therapy session without his R ankle giving way or causing an increase in pain.  NEW, 04/03/2022     Plan: Continue following current POC.  New goals to address his ankle have been added to the POC.  Continue with goals for his LBP.                 Total Session Time 38 and Timed code minutes 38  THERAPEUTIC EXERCISE 10 minutes and NEURO RE-EDUCATION [place], PTA  04/12/2022, 16:13

## 2022-04-15 IMAGING — MR MRI LUMBAR SPINE WITHOUT CONTRAST
5 of 6 series · 32 of 48 positions shown · non-contrast
Comparison: None available.

﻿EXAM:  07963   MRI LUMBAR SPINE WITHOUT CONTRAST
INDICATION: Fall.
TECHNIQUE: Noncontrast multiplanar, multisequence MRI was performed.

[Series 5: T2 · sagittal · 4.0mm · 0.94mm/px · 6 of 13 slices shown (1 of 3)]
[im 1/13]
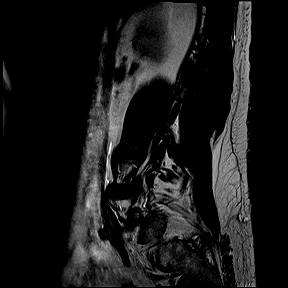
[im 3/13]
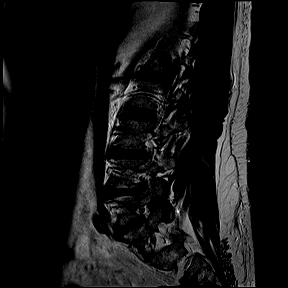
[im 5/13]
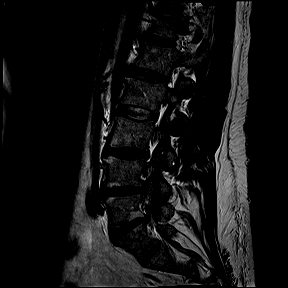
[im 8/13]
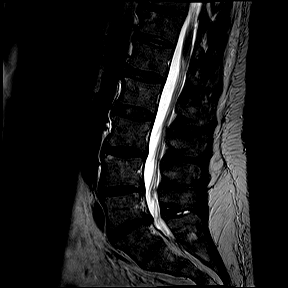
[im 10/13]
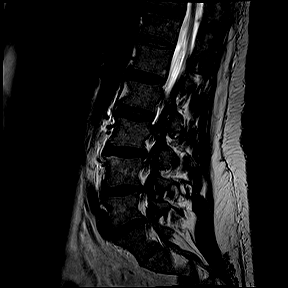
[im 13/13]
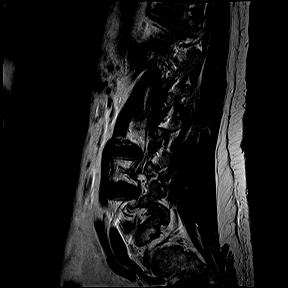

[Series 6: T1 · sagittal · 4.0mm · 0.94mm/px · 6 of 13 slices shown (1 of 2)]
[im 1/13]
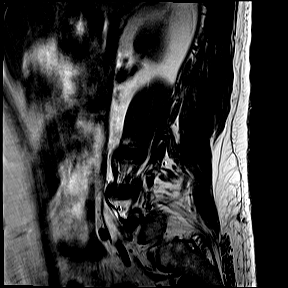
[im 3/13]
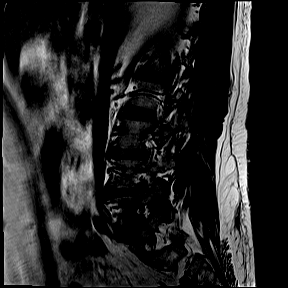
[im 5/13]
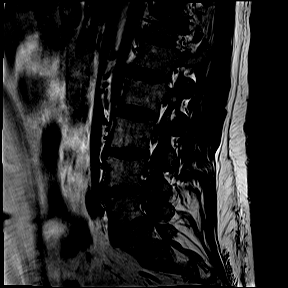
[im 8/13]
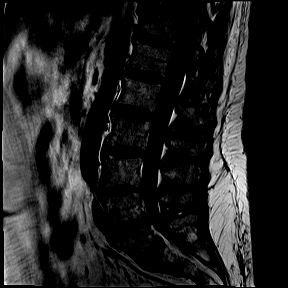
[im 10/13]
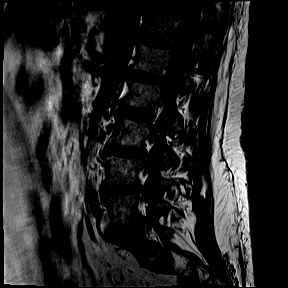
[im 13/13]
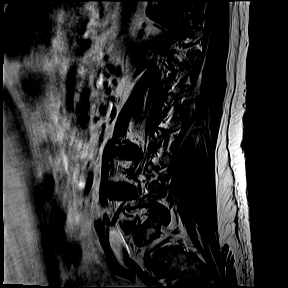

[Series 8: T2 · coronal · 5.0mm · 0.82mm/px · 8 of 18 slices shown (2 of 3)]
[im 1/18]
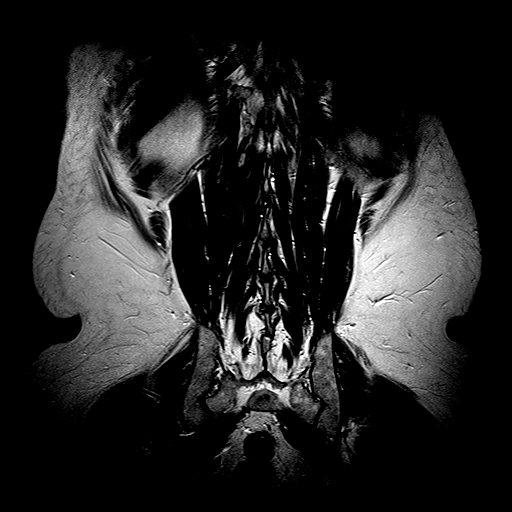
[im 3/18]
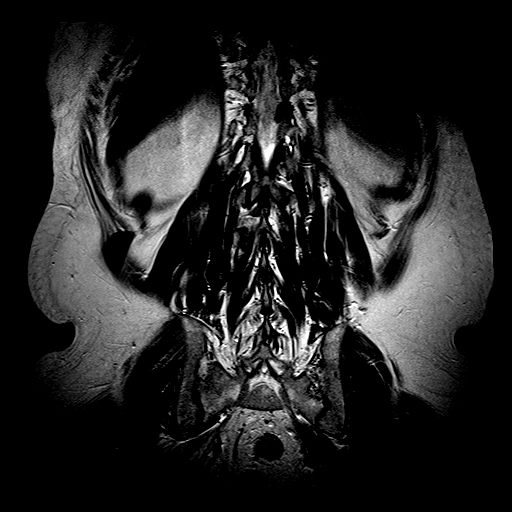
[im 5/18]
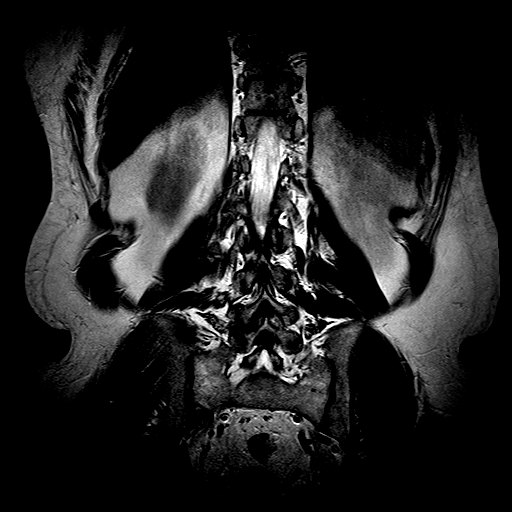
[im 8/18]
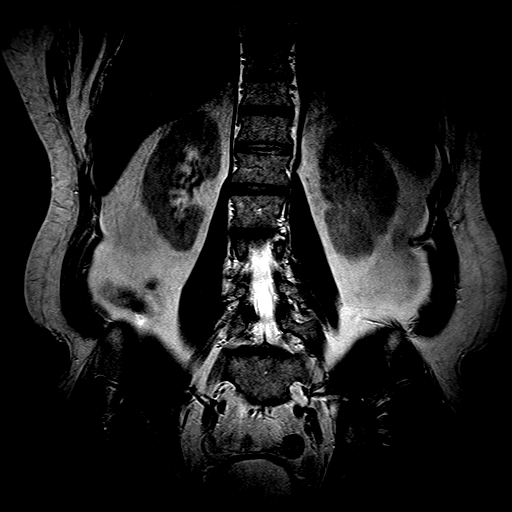
[im 10/18]
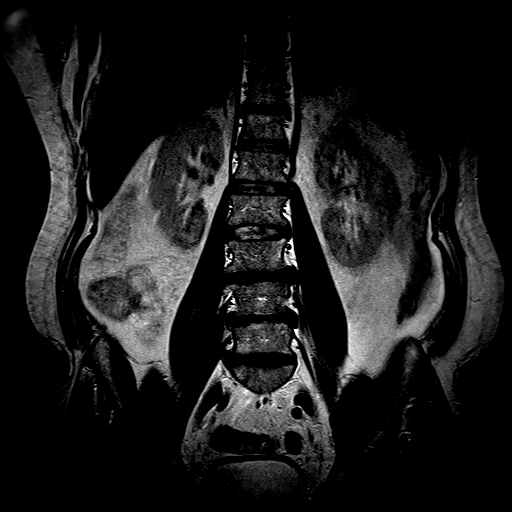
[im 13/18]
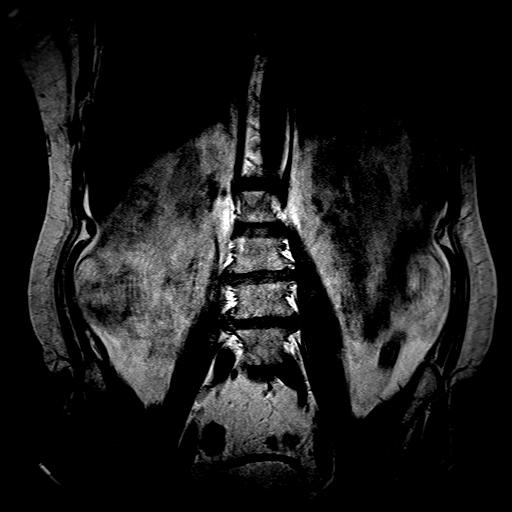
[im 15/18]
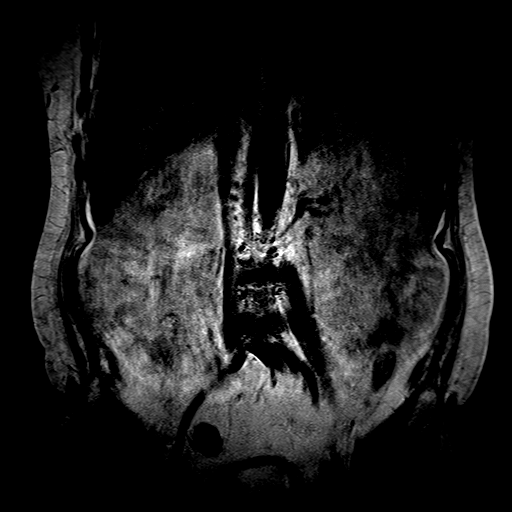
[im 18/18]
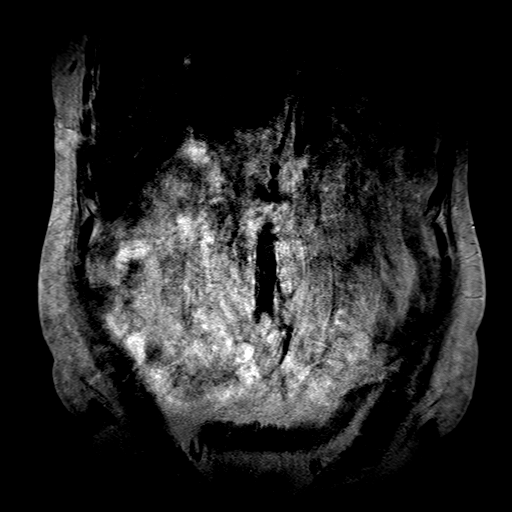

[Series 9: T2 · axial · 4.0mm · 0.52mm/px · z∈[-122,+86]mm · 11 of 23 slices shown (3 of 3)]
[im 1/23]
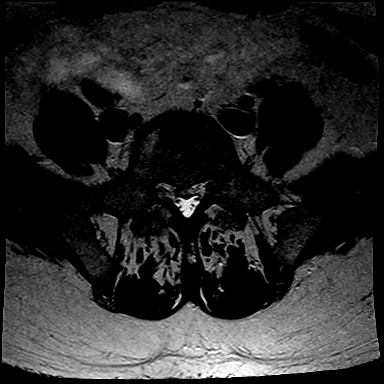
[im 3/23]
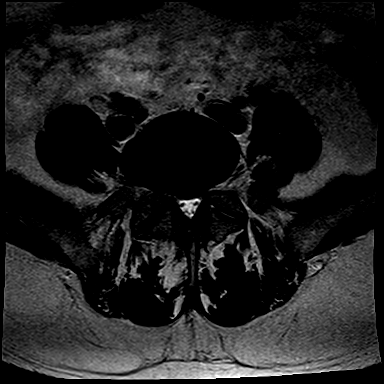
[im 5/23]
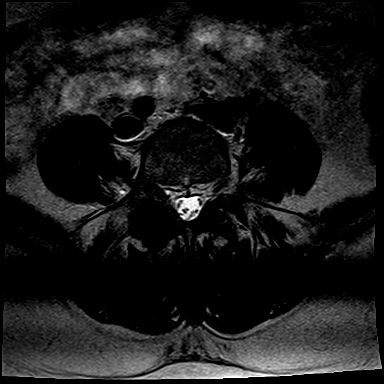
[im 7/23]
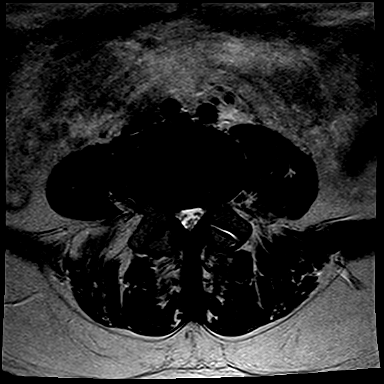
[im 9/23]
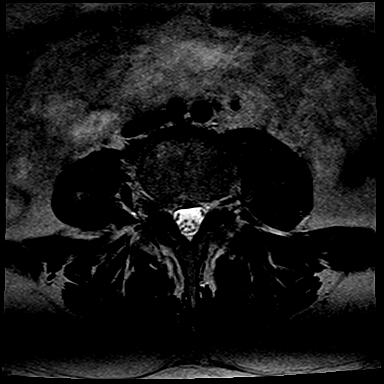
[im 12/23]
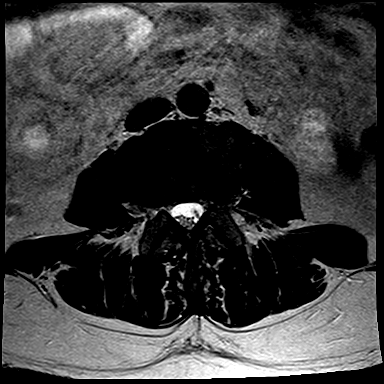
[im 14/23]
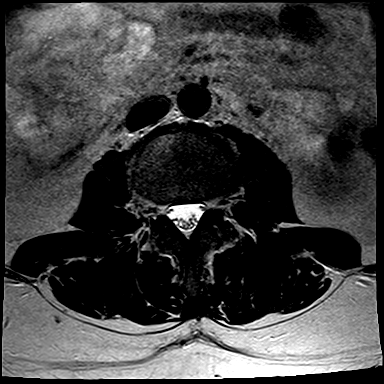
[im 16/23]
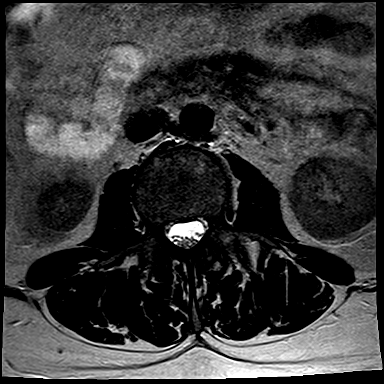
[im 18/23]
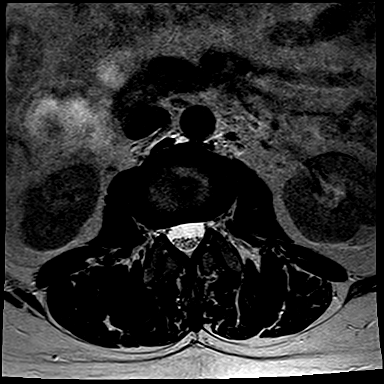
[im 20/23]
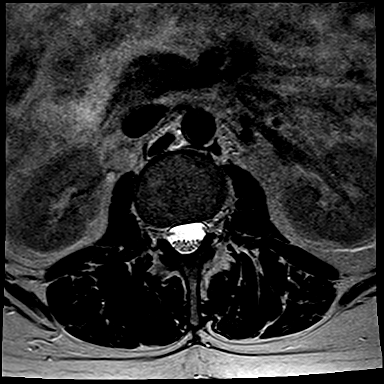
[im 23/23]
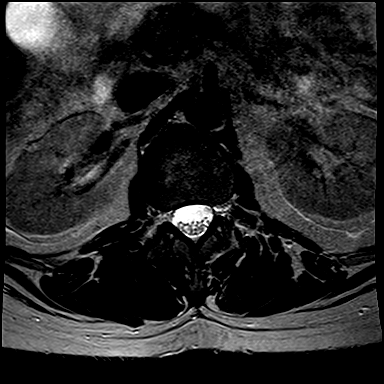

[Series 10: T1 · axial · 4.0mm · 0.52mm/px · 1 of 23 slices shown (2 of 2)]
[im 1/23]
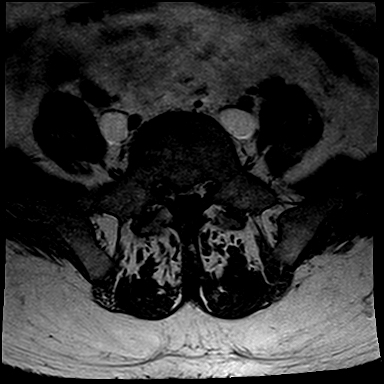

[32 of 48 positions shown; findings below may reference images not displayed]

FINDINGS: There are mild degenerative changes at multiple levels with disc desiccation, small osteophyte formation, and mild disc space narrowing.  L2-L3 is relatively unaffected by degenerative change.  

L1-L2 and L2-L3 appear unremarkable.  

At L3-L4, there is minimal disc bulging.  

At L4-L5, there is a small right lateral disc herniation with effacement of the epidural fat surrounding the right L4 nerve root with possible compression of the right L4 nerve root.  

At L5-S1, there is a small, central disc herniation with slight effacement of the epidural fat and possible impression of the S1 nerve roots.  

There is no fracture, malalignment, significant marrow signal alteration, or significant spinal stenosis.  The conus appears unremarkable.
IMPRESSION: 1. Small right lateral L4-L5 disc herniation. 

2. Small central L5-S1 disc herniation.  

3. Mild multilevel degenerative changes.

## 2022-04-17 ENCOUNTER — Ambulatory Visit
Admission: RE | Admit: 2022-04-17 | Discharge: 2022-04-17 | Disposition: A | Discharge status: Still In Hospital | Payer: Worker's Comp, Other unspecified | Source: Ambulatory Visit | Attending: ORTHOPEDIC, SPORTS MEDICINE | Admitting: ORTHOPEDIC, SPORTS MEDICINE

## 2022-04-17 ENCOUNTER — Other Ambulatory Visit: Payer: Self-pay

## 2022-04-17 NOTE — PT Treatment (Signed)
[facility]  Outpatient Physical Therapy  [place], [address]  [phone]  [phone]    Physical Therapy Treatment Note    Date: 04/17/2022  Patient's Name: Johnathan Washington  Date of Birth: 25-Oct-1970    Visit #/POC:9/36  Authorization:none given  POC Signed?: yes  POC Ends: 06/12/2022  Next Progress Note Due: 05/03/2022     Evaluating Physical Therapist: Darrol Jump, MSPT  PT diagnosis/Reason for Referral:   Lumbar sprain - current treatment for this POC  R ankle sprain S93.491 - added to this POC     Orders and PT diagnosis/Reason for Referral also include:   Cervical sprain (not treating now) - Script written 02/13/2022  Thoraic sprain (not treating now) - script written 02/13/2022  R ankle sprain S93.491D (Dr. Durward Fortes - not treating yet) - script written 03/05/2022  Next Scheduled Physician Appointment: 04/29/2022 appointment with Dr. Posey Pronto for back, thoracic, and neck pain.  New MRI of lumbar spine.  Pt sees Dr. Durward Fortes for a follow up for the R knee and R foot on 05/23/2022.   Doctor wanted him to continue with proprioception and strength training for R leg.    Allergies/Contraindications: allegra, flu shot, and Bee venom     Subjective:  Pt continuing to wean off knee and ankle brace by next appointment with Dr. Durward Fortes. Pt reported that his back popped 2 on Sunday while doing superman and bird dog exercises.  He was in extreme pain with this "pop" and pain radiated down his leg.  However, this resolved several hours later.  No longer having this issue.  Pt has resumed these exercises with caution and is tolerating a slow progression in reps with these without a reoccurrence.    MRI results mild degenerative changes at multiple levels with disc desiccation, small osteophyte formation, and mild disc space narrowing.  L3-L4 minimal disc bulging.  L4-L5 small R lateral disc herniation with effacement of the epidural fat surroundings the right L4 nerve  root with possible compression of the right L4 nerve root.  L5-S1 small central disc herniation with slight effacement of the epidural fat and possible impression of the SI nerve root.      Objective: interventions in therapy today are listed in chart below:                  EXERCISE/ACTIVITY NAME REPETITIONS RESISTANCE COMPLETED THIS DOS   LAQ    3 x 10 No resistance n; HEP 03/20/2022   Hip abd    R x 30  L x 15 (stopped due to pain and therapist agreement) No resistance  n; HEP   03/20/2022   Hip extension    X30  No resistance y; HEP 03/20/2022   Hip flexion     Marches x30    No resistance Y; HEP 03/20/2022   Heel raises  x30  Y   Toe raises X30   Y    Mini squat    B x 20 No resistance n; HEP 03/20/2022   Hamstring stretch 1 foot up and 1 foot down    30 sec x 3 No resistance n; HEP 03/20/2022   Manual therapy at cervical, thoracic, and lumbar spine    15 min over the R hip, It band, piriformis, sciatic nerve, and surrounding muscles manual n       standing lumbar ext   x10    n   Short sitting lumbar extension over therapy  ball 2 x 10   n; HEP 03/28/2022 to use with standing or in place of standing extension (either 2 x 10 reps multiple times a day)     Patient ed with HEP reps         n   Ankle proprioception on airex mat SLS 5 sec holds x 5   N   Ankle strengthening PF, DF, Inv, Ever x 10 each  Yellow t-band  N    BOSU step ups fwd x10   no   Balance machine   LOS  Random control  Maze control       X3  X3  x2       Level 9  Level 9  Level 9       Yes  Yes  yes   Bird dog  x10   n   superman x5   n   Hip abd prone     n   Heel squeeze      n   HEP review/update     n   Dyna disc fwd/backwards weight shift  Ball toss rebounder               2# ball N           N   Toe raises with ball between heels   2# N    NuStep   L5 - 10 min at the end of the session; no skilled supervision    Y (not billed)        Assessment:  Pt continues to struggle with balance.  Recommend continuing with balance activities.  Continue with  strengthening for back and LE.         Short Term Goals: 6 Weeks              -BACK - Pt will decrease pain in his cervical spine so that he reports that it is a 0/10 50% of the time.  PROGRESSING, 04/03/2022              -BACK - Pt will decrease pain in his thoracic spine so that he reports that it is a 0/10 50% of the time. PROGRESSING, 04/03/2022              -BACK - Pt will decrease pain in his lumbar spine to intermittent and report no radiculopathy into his legs more than 4/7 days a week.  PROGRESSING, 04/03/2022              -BACK - Pt will improve his PSFS (functionals skills) to an average of 3 or better. PROGRESSING, 04/03/2022              -BACK - Pt will improve his forward trunk flexion to 20 cm to the floor or less.  PROGRESSING, 04/03/2022              -BACK - Pt will improve his side flexion bilaterally to 35 cm to the floor or less. PROGRESSING, 04/03/2022              -BACK - Pt will improve all LE/trunk/cervical MMT to 4/5.  PROGRESSING, 04/03/2022              -ANKLE - Pt will improve his ankle strength and balance so that he can stand on his R ankle for 10 sec without radiculopathy spreading up his leg. NEW, 04/03/2022              -ANKLE -  Pt will improve his R ankle strength to 4/5 in all directions no more than 3 "pops" (crepitus) per 10 reps each direction.  NEW, 04/03/2022              -ANKLE - Pt will improve his R ankle eversion to 10 degrees of AROM. NEW, 04/03/2022           Long Term Goals: 12  Weeks - ALL LTG ARE NOT MET AS OF 04/03/2022              -BACK - Pt will decrease pain in his cervical spine so that he reports that it is a 0/10 75% of the time.                -BACK - Pt will decrease pain in his thoracic spine so that he reports that it is a 0/10 75% of the time.              -BACK - Pt will decrease pain in his lumbar spine to intermittent and report no radiculopathy into his legs more than 2/7 days a week.               -BACK - Pt will improve his PSFS (functionals skills) to an  average of 6 or better.              -BACK - Pt will improve his forward trunk flexion to 15 cm to the floor or less.              -BACK - Pt will improve his side flexion bilaterally to 30 cm to the floor or less.              -BACK - Pt will improve all LE/trunk/cervical MMT to 5/5.               -ANKLE - Pt will improve his ankle strength and balance so that he can stand on his R ankle for 20 sec without radiculopathy spreading up his leg. NEW, 04/03/2022              -ANKLE - Pt will improve his R ankle strength to 4+/5 in all directions no more than 3 "pops" (crepitus) per 10 reps each direction.  NEW, 04/03/2022              -ANKLE - Pt will be able to do forward step ups,, lateral steps ups, and step overs on the BOSU ball x 10 each direction per therapy session without his R ankle giving way or causing an increase in pain.  NEW, 04/03/2022     Plan: Continue following current POC.  New goals to address his ankle have been added to the POC.  Continue with goals for his LBP.       Total Session Time 30 and Timed code minutes 30  THERAPEUTIC EXERCISE 15 minutes and NEURO RE-EDUCATION 15MINUTES    Darrol Jump  04/17/2022, 18:11

## 2022-04-19 ENCOUNTER — Other Ambulatory Visit: Payer: Self-pay

## 2022-04-19 ENCOUNTER — Ambulatory Visit (HOSPITAL_COMMUNITY)
Admission: RE | Admit: 2022-04-19 | Discharge: 2022-04-19 | Disposition: A | Discharge status: Still In Hospital | Payer: Worker's Comp, Other unspecified | Source: Ambulatory Visit | Attending: ORTHOPEDIC, SPORTS MEDICINE | Admitting: ORTHOPEDIC, SPORTS MEDICINE

## 2022-04-19 DIAGNOSIS — S93491D Sprain of other ligament of right ankle, subsequent encounter: Secondary | ICD-10-CM | POA: Insufficient documentation

## 2022-04-19 NOTE — PT Treatment (Addendum)
[facility]  Outpatient Physical Therapy  [place], [address]  [phone]  [phone]    Physical Therapy Treatment Note    Date: 04/19/2022  Patient's Name: Johnathan Washington  Date of Birth: 1971-04-05        Visit #/POC:10/36  Authorization:none given  POC Signed?: yes  POC Ends: 06/12/2022  Next Progress Note Due: 05/03/2022     Evaluating Physical Therapist: Darrol Jump, MSPT  PT diagnosis/Reason for Referral:   Lumbar sprain - current treatment for this POC  R ankle sprain S93.491 - added to this POC     Orders and PT diagnosis/Reason for Referral also include:   Cervical sprain (not treating now) - Script written 02/13/2022  Thoraic sprain (not treating now) - script written 02/13/2022  R ankle sprain S93.491D (Dr. Durward Fortes - not treating yet) - script written 03/05/2022  Next Scheduled Physician Appointment: 04/29/2022 appointment with Dr. Posey Pronto for back, thoracic, and neck pain.  New MRI of lumbar spine.  Pt sees Dr. Durward Fortes for a follow up for the R knee and R foot on 05/23/2022.   Doctor wanted him to continue with proprioception and strength training for R leg.    Allergies/Contraindications: allegra, flu shot, and Bee venom     Subjective:  Pt continuing to wean off knee and ankle brace. He states that he sitting bent over to do a blood draw and when he stood up he had increase radicular symptoms and couldn't even take a step. He also states that he  is having spasms and tingling feeling from middle shoulder blades down. States at times it will go all th way down his back.  Patient states he continues to be compliant with his HEP      MRI results mild degenerative changes at multiple levels with disc desiccation, small osteophyte formation, and mild disc space narrowing.  L3-L4 minimal disc bulging.  L4-L5 small R lateral disc herniation with effacement of the epidural fat surroundings the right L4 nerve root with possible compression of the right  L4 nerve root.  L5-S1 small central disc herniation with slight effacement of the epidural fat and possible impression of the SI nerve root.       Objective: interventions in therapy today are listed in chart below:        Measured ROM: Not Assessed       EXERCISE/ACTIVITY NAME REPETITIONS RESISTANCE COMPLETED THIS DOS   LAQ    3 x 10 No resistance n; HEP 03/20/2022   Hip abd    R x 30  L x 15 (stopped due to pain and therapist agreement) No resistance  n; HEP   03/20/2022   Hip extension    X30  No resistance n; HEP 03/20/2022   Hip flexion     Marches x30    No resistance Y; HEP 03/20/2022   Heel raises  x30   n   Toe raises X30    n   Mini squat    B x 20 No resistance n; HEP 03/20/2022   Hamstring stretch 1 foot up and 1 foot down    30 sec x 3 No resistance n; HEP 03/20/2022   Manual therapy at cervical, thoracic, and lumbar spine    15 min over the R hip, It band, piriformis, sciatic nerve, and surrounding muscles manual n       standing lumbar ext   x10    n   Short  sitting lumbar extension over therapy ball 2 x 10   n; HEP 03/28/2022 to use with standing or in place of standing extension (either 2 x 10 reps multiple times a day)     Patient ed with HEP reps         n   Ankle proprioception on airex mat SLS 5 sec holds x 5   N   Ankle strengthening PF, DF, Inv, Ever x 10 each  Yellow t-band  N    BOSU step ups fwd x10   no   Balance machine   LOS  Random control  Maze control       X3  X3  x2       Level 9  Level 9  Level 9       Yes  Yes  yes   Bird dog  x10   n   superman x5   n   Hip abd prone     n   Heel squeeze      n   HEP review/update     n   Dyna disc fwd/backwards weight shift  Ball toss rebounder  Fwd and lateral                2# ball n           Y  y   Toe raises with ball between heels   2# N    NuStep   L5 - 10 min at the end of the session; no skilled supervision    n (not billed)    Thoracic ext PB with neck supported x10  y   Thread the needle x10  y   Joint mobs thoracic PA   y       Assessment:  Pt  continues to have difficulty with balance/proprioception. Noted cavitation even with light pressure throughout thoracic. Noted tightness with thoracic rotation and extension.      Short Term Goals: 6 Weeks              -BACK - Pt will decrease pain in his cervical spine so that he reports that it is a 0/10 50% of the time.  PROGRESSING, 04/03/2022              -BACK - Pt will decrease pain in his thoracic spine so that he reports that it is a 0/10 50% of the time. PROGRESSING, 04/03/2022              -BACK - Pt will decrease pain in his lumbar spine to intermittent and report no radiculopathy into his legs more than 4/7 days a week.  PROGRESSING, 04/03/2022              -BACK - Pt will improve his PSFS (functionals skills) to an average of 3 or better. PROGRESSING, 04/03/2022              -BACK - Pt will improve his forward trunk flexion to 20 cm to the floor or less.  PROGRESSING, 04/03/2022              -BACK - Pt will improve his side flexion bilaterally to 35 cm to the floor or less. PROGRESSING, 04/03/2022              -BACK - Pt will improve all LE/trunk/cervical MMT to 4/5.  PROGRESSING, 04/03/2022              -ANKLE - Pt will improve his  ankle strength and balance so that he can stand on his R ankle for 10 sec without radiculopathy spreading up his leg. NEW, 04/03/2022              -ANKLE - Pt will improve his R ankle strength to 4/5 in all directions no more than 3 "pops" (crepitus) per 10 reps each direction.  NEW, 04/03/2022              -ANKLE - Pt will improve his R ankle eversion to 10 degrees of AROM. NEW, 04/03/2022           Long Term Goals: 12  Weeks - ALL LTG ARE NOT MET AS OF 04/03/2022              -BACK - Pt will decrease pain in his cervical spine so that he reports that it is a 0/10 75% of the time.                -BACK - Pt will decrease pain in his thoracic spine so that he reports that it is a 0/10 75% of the time.              -BACK - Pt will decrease pain in his lumbar spine to intermittent and  report no radiculopathy into his legs more than 2/7 days a week.               -BACK - Pt will improve his PSFS (functionals skills) to an average of 6 or better.              -BACK - Pt will improve his forward trunk flexion to 15 cm to the floor or less.              -BACK - Pt will improve his side flexion bilaterally to 30 cm to the floor or less.              -BACK - Pt will improve all LE/trunk/cervical MMT to 5/5.               -ANKLE - Pt will improve his ankle strength and balance so that he can stand on his R ankle for 20 sec without radiculopathy spreading up his leg. NEW, 04/03/2022              -ANKLE - Pt will improve his R ankle strength to 4+/5 in all directions no more than 3 "pops" (crepitus) per 10 reps each direction.  NEW, 04/03/2022              -ANKLE - Pt will be able to do forward step ups,, lateral steps ups, and step overs on the BOSU ball x 10 each direction per therapy session without his R ankle giving way or causing an increase in pain.  NEW, 04/03/2022     Plan: Continue with strengthening for back and LE. Update HEP for thoracic ext according to response to treatment. Assess ROM next visit.        Total Session Time 38 and Timed code minutes 38  THERAPEUTIC EXERCISE 8 minutes and NEURO RE-EDUCATION 30MINUTES      Carlton Adam, PTA  04/19/2022, 07:58

## 2022-04-23 ENCOUNTER — Ambulatory Visit (HOSPITAL_COMMUNITY)
Admission: RE | Admit: 2022-04-23 | Discharge: 2022-04-23 | Disposition: A | Discharge status: Still In Hospital | Payer: Worker's Comp, Other unspecified | Source: Ambulatory Visit | Attending: ORTHOPEDIC, SPORTS MEDICINE | Admitting: ORTHOPEDIC, SPORTS MEDICINE

## 2022-04-23 ENCOUNTER — Other Ambulatory Visit: Payer: Self-pay

## 2022-04-23 NOTE — PT Treatment (Signed)
[facility]  Outpatient Physical Therapy  [place], [address]  [phone]  [phone]    Physical Therapy Treatment Note    Date: 04/23/2022  Patient's Name: Johnathan Washington  Date of Birth: 25-Apr-1971        Visit #/POC:11/36  Authorization:none given  POC Signed?: yes  POC Ends: 06/12/2022  Next Progress Note Due: 05/03/2022     Evaluating Physical Therapist: Darrol Jump, MSPT  PT diagnosis/Reason for Referral:   Lumbar sprain - current treatment for this POC  R ankle sprain S93.491 - added to this POC     Orders and PT diagnosis/Reason for Referral also include:   Cervical sprain (not treating now) - Script written 02/13/2022  Thoraic sprain (not treating now) - script written 02/13/2022  R ankle sprain S93.491D (Dr. Durward Fortes - not treating yet) - script written 03/05/2022  Next Scheduled Physician Appointment: 04/29/2022 appointment with Dr. Posey Pronto for back, thoracic, and neck pain.  New MRI of lumbar spine.  Pt sees Dr. Durward Fortes for a follow up for the R knee and R foot on 05/23/2022.   Doctor wanted him to continue with proprioception and strength training for R leg.    Allergies/Contraindications: allegra, flu shot, and Bee venom     Subjective:  Pt states that over weekend he started having tremors (feels like large worms crawling)in (R) butt cheek to thigh. No HEP stretches correct or decreased the symptoms. At times the intensity would vary. Patient states he started having (R)  calf pain and ankle soreness.         MRI results mild degenerative changes at multiple levels with disc desiccation, small osteophyte formation, and mild disc space narrowing.  L3-L4 minimal disc bulging.  L4-L5 small R lateral disc herniation with effacement of the epidural fat surroundings the right L4 nerve root with possible compression of the right L4 nerve root.  L5-S1 small central disc herniation with slight effacement of the epidural fat and possible impression of  the SI nerve root.       Objective: interventions in therapy today are listed in chart below:         Measured ROM: Not Assessed       EXERCISE/ACTIVITY NAME REPETITIONS RESISTANCE COMPLETED THIS DOS   LAQ    3 x 10 No resistance n; HEP 03/20/2022   Hip abd    R x 30  L x 15 (stopped due to pain and therapist agreement) No resistance  n; HEP   03/20/2022   Hip extension    X30  No resistance n; HEP 03/20/2022   Hip flexion     Marches x30    No resistance n; HEP 03/20/2022   Heel raises  x30   n   Toe raises X30    n   Mini squat    B x 20 No resistance n; HEP 03/20/2022   Hamstring stretch 1 foot up and 1 foot down    30 sec x 3 No resistance n; HEP 03/20/2022   Manual therapy at cervical, thoracic, and lumbar spine    15 min over the R hip, It band, piriformis, sciatic nerve, and surrounding muscles manual n       standing lumbar ext   x10    n   Short sitting lumbar extension over therapy ball 2 x 10   n; HEP 03/28/2022 to use with standing or in place of standing extension (either 2 x 10  reps multiple times a day)     Patient ed with HEP reps         n   Ankle proprioception on airex mat SLS 5 sec holds x 5   N   Ankle strengthening PF, DF, Inv, Ever x 10 each  Yellow t-band  N    BOSU step ups fwd x10   no   Balance machine   LOS  Random control  Maze control       X3  X3  x2       Level 9  Level 9  Level 9       no  no  no   Bird dog  x10   n   superman x5   n   Hip abd prone     n   Heel squeeze      n   HEP review/update     n   Dyna disc fwd/backwards weight shift  Ball toss rebounder  Fwd and lateral                2# ball n           n  n   Toe raises with ball between heels   2# N    NuStep   L5 - 10 min at the end of the session; no skilled supervision    n (not billed)    Thoracic ext PB with neck supported x10   y   Thread the needle x10   n   Joint mobs thoracic PA     n   Incline stretch 2x 60 sec (B) Large y   Piriformis stretch supine 3x15 sec hold(B) Manual  y   MFR (R) piriformis Trigger point release   y    HEP Review   y   Discontinued superman from HEP.    Assessment: Noted tightness in (B) piriformis (R)>(L) Patient would benefit from medical massage(which has been order by MD) secondary to good response to piriformis release. Noted relaxation of muscle tone following trigger point release. Noted increase radicular symptoms since initiating superman. Discontinued this from current HEP.      Short Term Goals: 6 Weeks              -BACK - Pt will decrease pain in his cervical spine so that he reports that it is a 0/10 50% of the time.  PROGRESSING, 04/03/2022              -BACK - Pt will decrease pain in his thoracic spine so that he reports that it is a 0/10 50% of the time. PROGRESSING, 04/03/2022              -BACK - Pt will decrease pain in his lumbar spine to intermittent and report no radiculopathy into his legs more than 4/7 days a week.  PROGRESSING, 04/03/2022              -BACK - Pt will improve his PSFS (functionals skills) to an average of 3 or better. PROGRESSING, 04/03/2022              -BACK - Pt will improve his forward trunk flexion to 20 cm to the floor or less.  PROGRESSING, 04/03/2022              -BACK - Pt will improve his side flexion bilaterally to 35 cm to the floor or less. PROGRESSING, 04/03/2022              -  BACK - Pt will improve all LE/trunk/cervical MMT to 4/5.  PROGRESSING, 04/03/2022              -ANKLE - Pt will improve his ankle strength and balance so that he can stand on his R ankle for 10 sec without radiculopathy spreading up his leg. NEW, 04/03/2022              -ANKLE - Pt will improve his R ankle strength to 4/5 in all directions no more than 3 "pops" (crepitus) per 10 reps each direction.  NEW, 04/03/2022              -ANKLE - Pt will improve his R ankle eversion to 10 degrees of AROM. NEW, 04/03/2022           Long Term Goals: 12  Weeks - ALL LTG ARE NOT MET AS OF 04/03/2022              -BACK - Pt will decrease pain in his cervical spine so that he reports that it is a 0/10 75% of  the time.                -BACK - Pt will decrease pain in his thoracic spine so that he reports that it is a 0/10 75% of the time.              -BACK - Pt will decrease pain in his lumbar spine to intermittent and report no radiculopathy into his legs more than 2/7 days a week.               -BACK - Pt will improve his PSFS (functionals skills) to an average of 6 or better.              -BACK - Pt will improve his forward trunk flexion to 15 cm to the floor or less.              -BACK - Pt will improve his side flexion bilaterally to 30 cm to the floor or less.              -BACK - Pt will improve all LE/trunk/cervical MMT to 5/5.               -ANKLE - Pt will improve his ankle strength and balance so that he can stand on his R ankle for 20 sec without radiculopathy spreading up his leg. NEW, 04/03/2022              -ANKLE - Pt will improve his R ankle strength to 4+/5 in all directions no more than 3 "pops" (crepitus) per 10 reps each direction.  NEW, 04/03/2022              -ANKLE - Pt will be able to do forward step ups,, lateral steps ups, and step overs on the BOSU ball x 10 each direction per therapy session without his R ankle giving way or causing an increase in pain.  NEW, 04/03/2022     Plan: Continue with strengthening for back and LE. Assess ROM next visit. Progress balance and proprioception next visit.         Total Session Time 30 and Timed code minutes 30  THERAPEUTIC EXERCISE 20 minutes and JOINT MOBILIZATION/MFR 10 minutes      Carlton Adam, PTA  04/23/2022, 17:52

## 2022-04-25 ENCOUNTER — Other Ambulatory Visit: Payer: Self-pay

## 2022-04-25 ENCOUNTER — Ambulatory Visit (HOSPITAL_COMMUNITY)
Admission: RE | Admit: 2022-04-25 | Discharge: 2022-04-25 | Disposition: A | Discharge status: Still In Hospital | Payer: Worker's Comp, Other unspecified | Source: Ambulatory Visit | Attending: ORTHOPEDIC, SPORTS MEDICINE | Admitting: ORTHOPEDIC, SPORTS MEDICINE

## 2022-04-25 NOTE — PT Treatment (Signed)
[facility]  Outpatient Physical Therapy  [place], [address]  [phone]  [phone]    Physical Therapy Treatment Note    Date: 04/25/2022  Patient's Name: Johnathan Washington  Date of Birth: Feb 12, 1971    Visit #/POC:12/36  Authorization:none given  POC Signed?: yes  POC Ends: 06/12/2022  Next Progress Note Due: 05/03/2022     Evaluating Physical Therapist: Darrol Jump, MSPT  PT diagnosis/Reason for Referral:   Lumbar sprain - current treatment for this POC  R ankle sprain S93.491 - added to this POC     Orders and PT diagnosis/Reason for Referral also include:   Cervical sprain (not treating now) - Script written 02/13/2022  Thoraic sprain (not treating now) - script written 02/13/2022  R ankle sprain S93.491D (Dr. Durward Fortes - not treating yet) - script written 03/05/2022  Next Scheduled Physician Appointment: 04/29/2022 appointment with Dr. Posey Pronto for back, thoracic, and neck pain.  New MRI of lumbar spine.  Pt sees Dr. Durward Fortes for a follow up for the R knee and R foot on 05/23/2022.   Doctor wanted him to continue with proprioception and strength training for R leg.    Allergies/Contraindications: allegra, flu shot, and Bee venom     Subjective:  Pt reports that Tuesday night he woke up with really bad back pain.  It was so bad he was unable to sleep and tried multiple things to relieve without success.  Pain was in the LB with a jerking sensation/muscles spasms and the R jerking.  That has resolved but since this episode, pt continues to have a "locked" sensation in his lumbar spine are that radiates to the R leg and thigh.  This locked feeling happens with sit to stand transfers and resolves within a few minutes after standing.          MRI results mild degenerative changes at multiple levels with disc desiccation, small osteophyte formation, and mild disc space narrowing.  L3-L4 minimal disc bulging.  L4-L5 small R lateral disc herniation with effacement  of the epidural fat surroundings the right L4 nerve root with possible compression of the right L4 nerve root.  L5-S1 small central disc herniation with slight effacement of the epidural fat and possible impression of the SI nerve root.       Objective: interventions in therapy today are listed in chart below:         Measured ROM: Not Assessed       EXERCISE/ACTIVITY NAME REPETITIONS RESISTANCE COMPLETED THIS DOS   LAQ    3 x 10 No resistance n; HEP 03/20/2022   Hip abd    R x 30  L x 15 (stopped due to pain and therapist agreement) No resistance  n; HEP   03/20/2022   Hip extension    X30  No resistance n; HEP 03/20/2022   Hip flexion     Marches x30    No resistance n; HEP 03/20/2022   Heel raises  x30   n   Toe raises X30    n   Mini squat    B x 20 No resistance n; HEP 03/20/2022   Hamstring stretch 1 foot up and 1 foot down    30 sec x 3 No resistance n; HEP 03/20/2022   Manual therapy at cervical, thoracic, and lumbar spine    15 min over the R hip, It band, piriformis, sciatic nerve, and surrounding muscles manual n  standing lumbar ext   x10    n   Short sitting lumbar extension over therapy ball 2 x 10   n; HEP 03/28/2022 to use with standing or in place of standing extension (either 2 x 10 reps multiple times a day)     Patient ed with HEP reps         n   Ankle proprioception on airex mat SLS 5 sec holds x 5   N   Ankle strengthening PF, DF, Inv, Ever x 10 each  Yellow t-band  N    BOSU step ups fwd x10   no   Balance machine   LOS  Random control  Maze control       X3  X3  x2       Level 9  Level 9  Level 9       no  no  no   Bird dog  x10   n   superman x5   n   Hip abd prone     n   Heel squeeze      n   HEP review/update     n   Dyna disc fwd/backwards weight shift  Ball toss rebounder  Fwd and lateral                2# ball n           n  n   Toe raises with ball between heels   2# N    NuStep   L4 - 10 min; no skilled supervision    Y (not billed)    Thoracic ext PB with neck supported x10   n   Thread  the needle x10   n   Joint mobs thoracic PA     n   Incline stretch 2x 60 sec (B) Large n   Piriformis stretch supine Short sitting cross legs 30 sec x 3  y   MFR (R) piriformis Trigger point release    n   Traction (lumbar/mechanical)  14 min 45#/85#  50 sec on/10 sec off y   HEP Review     y   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     Assessment: Tried traction today to see if that would relieve "catching" feeling.  Pt tolerated well.  Assess at follow up visits to determine if we would like to continue this treatment in our sessions.  Pt continues to benefit from skilled outpatient physical therapy services.       Short Term Goals: 6 Weeks              -BACK - Pt will decrease pain in his cervical spine so that he reports that it is a 0/10 50% of the time.  PROGRESSING, 04/03/2022              -BACK - Pt will decrease pain in his thoracic spine so that he reports that it is a 0/10 50% of the time. PROGRESSING, 04/03/2022              -BACK - Pt will decrease pain in his lumbar spine to intermittent and report no radiculopathy into his legs more than 4/7 days a week.  PROGRESSING, 04/03/2022              -BACK - Pt will improve his PSFS (functionals skills) to an average of 3 or better. PROGRESSING, 04/03/2022              -  BACK - Pt will improve his forward trunk flexion to 20 cm to the floor or less.  PROGRESSING, 04/03/2022              -BACK - Pt will improve his side flexion bilaterally to 35 cm to the floor or less. PROGRESSING, 04/03/2022              -BACK - Pt will improve all LE/trunk/cervical MMT to 4/5.  PROGRESSING, 04/03/2022              -ANKLE - Pt will improve his ankle strength and balance so that he can stand on his R ankle for 10 sec without radiculopathy spreading up his leg. NEW, 04/03/2022              -ANKLE - Pt will improve his R ankle strength to 4/5 in all directions no more than 3 "pops" (crepitus) per 10 reps each direction.  NEW, 04/03/2022              -ANKLE - Pt will improve his R ankle eversion  to 10 degrees of AROM. NEW, 04/03/2022           Long Term Goals: 12  Weeks - ALL LTG ARE NOT MET AS OF 04/03/2022              -BACK - Pt will decrease pain in his cervical spine so that he reports that it is a 0/10 75% of the time.                -BACK - Pt will decrease pain in his thoracic spine so that he reports that it is a 0/10 75% of the time.              -BACK - Pt will decrease pain in his lumbar spine to intermittent and report no radiculopathy into his legs more than 2/7 days a week.               -BACK - Pt will improve his PSFS (functionals skills) to an average of 6 or better.              -BACK - Pt will improve his forward trunk flexion to 15 cm to the floor or less.              -BACK - Pt will improve his side flexion bilaterally to 30 cm to the floor or less.              -BACK - Pt will improve all LE/trunk/cervical MMT to 5/5.               -ANKLE - Pt will improve his ankle strength and balance so that he can stand on his R ankle for 20 sec without radiculopathy spreading up his leg. NEW, 04/03/2022              -ANKLE - Pt will improve his R ankle strength to 4+/5 in all directions no more than 3 "pops" (crepitus) per 10 reps each direction.  NEW, 04/03/2022              -ANKLE - Pt will be able to do forward step ups,, lateral steps ups, and step overs on the BOSU ball x 10 each direction per therapy session without his R ankle giving way or causing an increase in pain.  NEW, 04/03/2022     Plan: Continue with strengthening for back and LE. Continue with mechanical traction if  pt reports good results following today's treatment.  Assess ROM next visit. Progress balance and proprioception next visit.     Total Session Time 60, Timed code minutes 40, and Untimed code minutes 20  THERAPEUTIC EXERCISE 40 minutes and MECHANICAL TRACTION 20 minutes    Darrol Jump  04/25/2022, 18:06

## 2022-04-28 ENCOUNTER — Other Ambulatory Visit: Payer: Self-pay

## 2022-05-01 ENCOUNTER — Ambulatory Visit (HOSPITAL_COMMUNITY)
Admission: RE | Admit: 2022-05-01 | Discharge: 2022-05-01 | Disposition: A | Discharge status: Still In Hospital | Payer: Worker's Comp, Other unspecified | Source: Ambulatory Visit | Attending: ORTHOPEDIC, SPORTS MEDICINE | Admitting: ORTHOPEDIC, SPORTS MEDICINE

## 2022-05-01 ENCOUNTER — Other Ambulatory Visit: Payer: Self-pay

## 2022-05-01 NOTE — PT Treatment (Signed)
[facility]  Outpatient Physical Therapy  [place], [address]  [phone]  [phone]    Physical Therapy Treatment Note    Date: 05/01/2022  Patient's Name: Johnathan Washington  Date of Birth: 1971-01-30      Visit #/POC:13/36  Authorization:none given  POC Signed?: yes  POC Ends: 06/12/2022  Next Progress Note Due: 05/03/2022     Evaluating Physical Therapist: Darrol Jump, MSPT  PT diagnosis/Reason for Referral:   Lumbar sprain - current treatment for this POC  R ankle sprain S93.491 - added to this POC     Orders and PT diagnosis/Reason for Referral also include:   Cervical sprain (not treating now) - Script written 02/13/2022  Thoraic sprain (not treating now) - script written 02/13/2022  R ankle sprain S93.491D (Dr. Durward Fortes - not treating yet) - script written 03/05/2022  Next Scheduled Physician Appointment: 04/29/2022 appointment with Dr. Posey Pronto for back, thoracic, and neck pain.  New MRI of lumbar spine.  Pt sees Dr. Durward Fortes for a follow up for the R knee and R foot on 05/23/2022.   Doctor wanted him to continue with proprioception and strength training for R leg.    Allergies/Contraindications: allegra, flu shot, and Bee venom     Subjective:  Pt reports that Dr. Posey Pronto told him to finish up the PT(lumbar) he has left on current order/POC until his next follow up visit on 12/4. States that Dr. Posey Pronto will then decide his treatment plan regarding his back on 12/4. States Dr. Posey Pronto did write orders for aquatic rehab and medical massage. He states that this will be done at another clinic once approved by compensation.  Patient states that the traction did help with the radicular symptoms last visit. States that when he gets out of his car he does have increase pain in hip into legs.         MRI results mild degenerative changes at multiple levels with disc desiccation, small osteophyte formation, and mild disc space narrowing.  L3-L4 minimal disc bulging.   L4-L5 small R lateral disc herniation with effacement of the epidural fat surroundings the right L4 nerve root with possible compression of the right L4 nerve root.  L5-S1 small central disc herniation with slight effacement of the epidural fat and possible impression of the SI nerve root.       Objective: interventions in therapy today are listed in chart below:         Measured ROM: Not Assessed       EXERCISE/ACTIVITY NAME REPETITIONS RESISTANCE COMPLETED THIS DOS   LAQ    3 x 10 No resistance n; HEP 03/20/2022   Hip abd    R x 30  L x 15 (stopped due to pain and therapist agreement) No resistance  n; HEP   03/20/2022   Hip extension    X30  No resistance n; HEP 03/20/2022   Hip flexion     Marches x30    No resistance n; HEP 03/20/2022   Heel raises  x30   n   Toe raises X30    n   Mini squat    B x 20 No resistance n; HEP 03/20/2022   Hamstring stretch 1 foot up and 1 foot down    30 sec x 3 No resistance n; HEP 03/20/2022   Manual therapy at cervical, thoracic, and lumbar spine    15 min over the R hip, It band, piriformis, sciatic nerve,  and surrounding muscles manual n       standing lumbar ext   x10    n   Short sitting lumbar extension over therapy ball 2 x 10   n; HEP 03/28/2022 to use with standing or in place of standing extension (either 2 x 10 reps multiple times a day)     Patient ed for proper transition in/out of vehicle         y   Ankle proprioception on airex mat SLS 5 sec holds x 5   N   Ankle strengthening PF, DF, Inv, Ever x 10 each  Yellow t-band  N    BOSU step ups fwd x10   no   Balance machine   LOS  Random control  Maze control       X3  X3  x2       Level 9  Level 9  Level 9       no  no  no   Bird dog  x10   n   superman x5   n   Hip abd prone     n   Heel squeeze      n   HEP review/update     n   Dyna disc fwd/backwards weight shift  Ball toss rebounder  Fwd and lateral                2# ball n           n  n   Toe raises with ball between heels   2# N    NuStep   L4 - 10 min; no skilled  supervision    Y (not billed)    Thoracic ext PB with neck supported x10   n   Thread the needle x10   n   Joint mobs thoracic PA     n   Incline stretch 2x 60 sec (B) Large n   Piriformis stretch supine Short sitting cross legs 30 sec x 3   n   MFR (R) piriformis Trigger point release    n   Traction (lumbar/mechanical)  15 min 45#/85#  50 sec on/10 sec off y   HEP Review     n   Discontinued superman from HEP.     Assessment: Noted decrease radicular symptoms following mechanical traction. Patient was able to verbalize understanding of proper transfer/transitions in/out of vehicle following verbal and visual education.       Short Term Goals: 6 Weeks              -BACK - Pt will decrease pain in his cervical spine so that he reports that it is a 0/10 50% of the time.  PROGRESSING, 04/03/2022              -BACK - Pt will decrease pain in his thoracic spine so that he reports that it is a 0/10 50% of the time. PROGRESSING, 04/03/2022              -BACK - Pt will decrease pain in his lumbar spine to intermittent and report no radiculopathy into his legs more than 4/7 days a week.  PROGRESSING, 04/03/2022              -BACK - Pt will improve his PSFS (functionals skills) to an average of 3 or better. PROGRESSING, 04/03/2022              -BACK - Pt will  improve his forward trunk flexion to 20 cm to the floor or less.  PROGRESSING, 04/03/2022              -BACK - Pt will improve his side flexion bilaterally to 35 cm to the floor or less. PROGRESSING, 04/03/2022              -BACK - Pt will improve all LE/trunk/cervical MMT to 4/5.  PROGRESSING, 04/03/2022              -ANKLE - Pt will improve his ankle strength and balance so that he can stand on his R ankle for 10 sec without radiculopathy spreading up his leg. NEW, 04/03/2022              -ANKLE - Pt will improve his R ankle strength to 4/5 in all directions no more than 3 "pops" (crepitus) per 10 reps each direction.  NEW, 04/03/2022              -ANKLE - Pt will improve his  R ankle eversion to 10 degrees of AROM. NEW, 04/03/2022           Long Term Goals: 12  Weeks - ALL LTG ARE NOT MET AS OF 04/03/2022              -BACK - Pt will decrease pain in his cervical spine so that he reports that it is a 0/10 75% of the time.                -BACK - Pt will decrease pain in his thoracic spine so that he reports that it is a 0/10 75% of the time.              -BACK - Pt will decrease pain in his lumbar spine to intermittent and report no radiculopathy into his legs more than 2/7 days a week.               -BACK - Pt will improve his PSFS (functionals skills) to an average of 6 or better.              -BACK - Pt will improve his forward trunk flexion to 15 cm to the floor or less.              -BACK - Pt will improve his side flexion bilaterally to 30 cm to the floor or less.              -BACK - Pt will improve all LE/trunk/cervical MMT to 5/5.               -ANKLE - Pt will improve his ankle strength and balance so that he can stand on his R ankle for 20 sec without radiculopathy spreading up his leg. NEW, 04/03/2022              -ANKLE - Pt will improve his R ankle strength to 4+/5 in all directions no more than 3 "pops" (crepitus) per 10 reps each direction.  NEW, 04/03/2022              -ANKLE - Pt will be able to do forward step ups,, lateral steps ups, and step overs on the BOSU ball x 10 each direction per therapy session without his R ankle giving way or causing an increase in pain.  NEW, 04/03/2022     Plan: Continue with strengthening for back and LE. Continue with mechanical traction if pt reports good results  following today's treatment.  Assess ROM next visit. Progress balance and proprioception next visit.            Total Session Time 15, Timed code minutes 0, and Untimed code minutes 15  MECHANICAL TRACTION 15 minutes      Carlton Adam, PTA  05/01/2022, 16:14

## 2022-05-03 ENCOUNTER — Other Ambulatory Visit: Payer: Self-pay

## 2022-05-03 ENCOUNTER — Ambulatory Visit (HOSPITAL_COMMUNITY)
Admission: RE | Admit: 2022-05-03 | Discharge: 2022-05-03 | Disposition: A | Discharge status: Still In Hospital | Payer: Worker's Comp, Other unspecified | Source: Ambulatory Visit | Attending: ORTHOPEDIC, SPORTS MEDICINE | Admitting: ORTHOPEDIC, SPORTS MEDICINE

## 2022-05-03 NOTE — PT Treatment (Addendum)
[facility]  Outpatient Physical Therapy  [place], [address]  [phone]  [phone]    Physical Therapy Treatment Note    Date: 05/03/2022  Patient's Name: Johnathan Washington  Date of Birth: 22-Oct-1970      Visit #/POC:14/36  Authorization:none given  POC Signed?: yes  POC Ends: 06/12/2022  Next Progress Note Due: 05/03/2022     Evaluating Physical Therapist: Darrol Jump, MSPT  PT diagnosis/Reason for Referral:   Lumbar sprain - current treatment for this POC  R ankle sprain S93.491 - added to this POC     Orders and PT diagnosis/Reason for Referral also include:   Cervical sprain (not treating now) - Script written 02/13/2022  Thoraic sprain (not treating now) - script written 02/13/2022  R ankle sprain S93.491D (Dr. Durward Fortes - not treating yet) - script written 03/05/2022  Next Scheduled Physician Appointment: 04/29/2022 appointment with Dr. Posey Pronto for back, thoracic, and neck pain.  New MRI of lumbar spine.  Pt sees Dr. Durward Fortes for a follow up for the R knee and R foot on 05/23/2022.   Doctor wanted him to continue with proprioception and strength training for R leg.    Allergies/Contraindications: allegra, flu shot, and Bee venom     Subjective:  Pt reports he continues to have increase muscle spasms to the point he is now having muscle soreness. Patient stated that he has not wore his braces all week. Satte staht he continues to have pain and spasms in his piriformis.          MRI results mild degenerative changes at multiple levels with disc desiccation, small osteophyte formation, and mild disc space narrowing.  L3-L4 minimal disc bulging.  L4-L5 small R lateral disc herniation with effacement of the epidural fat surroundings the right L4 nerve root with possible compression of the right L4 nerve root.  L5-S1 small central disc herniation with slight effacement of the epidural fat and possible impression of the SI nerve root.       Objective:  interventions in therapy today are listed in chart below:         Measured ROM: Not Assessed       EXERCISE/ACTIVITY NAME REPETITIONS RESISTANCE COMPLETED THIS DOS   LAQ    3 x 10 No resistance n; HEP 03/20/2022   Hip abd    R x 30  L x 15 (stopped due to pain and therapist agreement) No resistance  n; HEP   03/20/2022   Hip extension    X30  No resistance n; HEP 03/20/2022   Hip flexion     Marches x30    No resistance n; HEP 03/20/2022   Heel raises  x30   n   Toe raises X30    n   Mini squat    B x 20 No resistance n; HEP 03/20/2022   Hamstring stretch 1 foot up and 1 foot down    30 sec x 3 No resistance n; HEP 03/20/2022   Manual therapy at cervical, thoracic, and lumbar spine    15 min over the R hip, It band, piriformis, sciatic nerve, and surrounding muscles manual n       standing lumbar ext   x10    n   Short sitting lumbar extension over therapy ball 2 x 10   n; HEP 03/28/2022 to use with standing or in place of standing extension (either 2 x 10 reps multiple times a day)  Patient ed for proper transition in/out of vehicle         y   Ankle proprioception on airex mat SLS 5 sec holds x 5   N   Ankle strengthening PF, DF, Inv, Ever x 10 each  Yellow t-band  N    BOSU step ups fwd x10   no   Balance machine   LOS  Random control  Maze control       X3  X3  x2       Level 9  Level 9  Level 9       yes  no  yes   Bird dog  x10   n   superman x5   n   Hip abd prone     n   Heel squeeze      n   HEP review/update     n   Dyna disc fwd/backwards weight shift  Ball toss rebounder  Fwd and lateral                2# ball n           y  Y fwd /backwards only   Toe raises with ball between heels   2# N    NuStep   L4 - 10 min; no skilled supervision    Y (not billed)    Thoracic ext PB with neck supported x10   n   Thread the needle x10   n   Joint mobs thoracic PA     n   Incline stretch 2x 60 sec (B) Large n   Piriformis stretch supine Short sitting cross legs 30 sec x 3   n   MFR (R) piriformis Trigger point release    n    Traction (lumbar/mechanical)  15 min 45#/85#  50 sec on/10 sec off y   HEP Review     Y gave green tband for ankle therex   Discontinued superman from HEP.     Assessment:Per subjective patient has completely weaned himself off both ankle and knee brace per MD orders. Noted increased difficulty with [employer] and had LOB x2 and corrected with handrail. Patient was given green thera band for HEP ankle therex. Continues to tolerated mechanical traction with decrease in radicular symptoms.      Short Term Goals: 6 Weeks              -BACK - Pt will decrease pain in his cervical spine so that he reports that it is a 0/10 50% of the time.  PROGRESSING, 04/03/2022              -BACK - Pt will decrease pain in his thoracic spine so that he reports that it is a 0/10 50% of the time. PROGRESSING, 04/03/2022              -BACK - Pt will decrease pain in his lumbar spine to intermittent and report no radiculopathy into his legs more than 4/7 days a week.  PROGRESSING, 04/03/2022              -BACK - Pt will improve his PSFS (functionals skills) to an average of 3 or better. PROGRESSING, 04/03/2022              -BACK - Pt will improve his forward trunk flexion to 20 cm to the floor or less.  PROGRESSING, 04/03/2022              -  BACK - Pt will improve his side flexion bilaterally to 35 cm to the floor or less. PROGRESSING, 04/03/2022              -BACK - Pt will improve all LE/trunk/cervical MMT to 4/5.  PROGRESSING, 04/03/2022              -ANKLE - Pt will improve his ankle strength and balance so that he can stand on his R ankle for 10 sec without radiculopathy spreading up his leg. NEW, 04/03/2022              -ANKLE - Pt will improve his R ankle strength to 4/5 in all directions no more than 3 "pops" (crepitus) per 10 reps each direction.  NEW, 04/03/2022              -ANKLE - Pt will improve his R ankle eversion to 10 degrees of AROM. NEW, 04/03/2022           Long Term Goals: 12  Weeks - ALL LTG ARE NOT MET AS OF 04/03/2022               -BACK - Pt will decrease pain in his cervical spine so that he reports that it is a 0/10 75% of the time.                -BACK - Pt will decrease pain in his thoracic spine so that he reports that it is a 0/10 75% of the time.              -BACK - Pt will decrease pain in his lumbar spine to intermittent and report no radiculopathy into his legs more than 2/7 days a week.               -BACK - Pt will improve his PSFS (functionals skills) to an average of 6 or better.              -BACK - Pt will improve his forward trunk flexion to 15 cm to the floor or less.              -BACK - Pt will improve his side flexion bilaterally to 30 cm to the floor or less.              -BACK - Pt will improve all LE/trunk/cervical MMT to 5/5.               -ANKLE - Pt will improve his ankle strength and balance so that he can stand on his R ankle for 20 sec without radiculopathy spreading up his leg. NEW, 04/03/2022              -ANKLE - Pt will improve his R ankle strength to 4+/5 in all directions no more than 3 "pops" (crepitus) per 10 reps each direction.  NEW, 04/03/2022              -ANKLE - Pt will be able to do forward step ups,, lateral steps ups, and step overs on the BOSU ball x 10 each direction per therapy session without his R ankle giving way or causing an increase in pain.  NEW, 04/03/2022     Plan: Continue with strengthening for back and LE. Continue with mechanical traction if pt reports good results following today's treatment.  Assess ROM next visit. Progress balance and proprioception as tolerated.  Total Session Time 38, Timed code minutes 23, and Untimed code minutes 15  MECHANICAL TRACTION 15 minutes and NEURO RE-EDUCATION 23MINUTES      Carlton Adam, PTA  05/03/2022, 16:13

## 2022-05-07 ENCOUNTER — Ambulatory Visit (HOSPITAL_COMMUNITY)
Admission: RE | Admit: 2022-05-07 | Discharge: 2022-05-07 | Disposition: A | Discharge status: Still In Hospital | Payer: Worker's Comp, Other unspecified | Source: Ambulatory Visit | Attending: ORTHOPEDIC, SPORTS MEDICINE | Admitting: ORTHOPEDIC, SPORTS MEDICINE

## 2022-05-07 ENCOUNTER — Other Ambulatory Visit: Payer: Self-pay

## 2022-05-07 NOTE — PT Treatment (Addendum)
[facility]  Outpatient Physical Therapy  [place], [address]  [id number]  [phone]    Physical Therapy Treatment Note    Date: 05/07/2022  Patient's Name: Johnathan Washington  Date of Birth: [birth year]      Visit #/POC:14/36  Authorization:none given  POC Signed?: yes  POC Ends: 06/12/2022  Next Progress Note Due: 05/03/2022     Evaluating Physical Therapist: Darrol Jump, MSPT  PT diagnosis/Reason for Referral:   Lumbar sprain - current treatment for this POC  R ankle sprain S93.491 - added to this POC     Orders and PT diagnosis/Reason for Referral also include:   Cervical sprain (not treating now) - Script written 02/13/2022  Thoraic sprain (not treating now) - script written 02/13/2022  R ankle sprain S93.491D (Dr. Durward Fortes - not treating yet) - script written 03/05/2022  Next Scheduled Physician Appointment: 04/29/2022 appointment with Dr. Posey Pronto for back, thoracic, and neck pain.  New MRI of lumbar spine.  Pt sees Dr. Durward Fortes for a follow up for the R knee and R foot on 05/23/2022.   Doctor wanted him to continue with proprioception and strength training for R leg.    Allergies/Contraindications: allegra, flu shot, and Bee venom     Subjective:  Pt reports he was standing at the kitchen table folding towels and his (R) knee gave out and he was unable to correct because his (R) ankle was rolled lateral. States his (R) knee has not gave out since Feb/March. States he did even know his (R) foot was placed like that. States he was able to grab back of chair and table to prevent himself from going to the floor. He twisted his back and hit his knee.           MRI results mild degenerative changes at multiple levels with disc desiccation, small osteophyte formation, and mild disc space narrowing.  L3-L4 minimal disc bulging.  L4-L5 small R lateral disc herniation with effacement of the epidural fat surroundings the right L4 nerve root with possible compression  of the right L4 nerve root.  L5-S1 small central disc herniation with slight effacement of the epidural fat and possible impression of the SI nerve root.       Objective: interventions in therapy today are listed in chart below:         Measured ROM: Not Assessed       EXERCISE/ACTIVITY NAME REPETITIONS RESISTANCE COMPLETED THIS DOS   LAQ    3 x 10 No resistance n; HEP 03/20/2022   Hip abd    R x 30  L x 15 (stopped due to pain and therapist agreement) No resistance  n; HEP   03/20/2022   Hip extension    X30  No resistance n; HEP 03/20/2022   Hip flexion     Marches x30    No resistance n; HEP 03/20/2022   Heel raises  x30   n   Toe raises X30    n   Mini squat    B x 20 No resistance n; HEP 03/20/2022   Hamstring stretch 1 foot up and 1 foot down    30 sec x 3 No resistance n; HEP 03/20/2022   Manual therapy at cervical, thoracic, and lumbar spine    15 min over the R hip, It band, piriformis, sciatic nerve, and surrounding muscles manual n       standing lumbar ext   x10  n   Short sitting lumbar extension over therapy ball 2 x 10   n; HEP 03/28/2022 to use with standing or in place of standing extension (either 2 x 10 reps multiple times a day)     Patient ed for proper transition in/out of vehicle         n   Ankle proprioception on airex mat SLS 5 sec holds x 5   N   Ankle strengthening PF, DF, Inv, Ever x 10 each  Yellow t-band  N    BOSU step ups fwd x10   no   Balance machine   LOS  Random control  Maze control       X3  X3  x2       Level 8  Level 8  Level 8       yes  yes  yes   Bird dog  x10   n   superman x5   n   Hip abd prone     n   Heel squeeze      n   HEP review/update     n   Dyna disc fwd/backwards weight shift  Ball toss rebounder  Fwd and lateral                2# ball n           n  n fwd /backwards only   Toe raises with ball between heels   2# N    NuStep   L4 - 10 min; no skilled supervision    n(not billed)    Thoracic ext PB with neck supported x10   y   Thread the needle x10   n   Joint mobs  thoracic PA     n   Incline stretch 2x 60 sec (B) Large n   Piriformis stretch supine Short sitting cross legs 30 sec x 3   n   MFR (R) piriformis, QL, lumbar paraspinals Trigger point release    y   Traction (lumbar/mechanical)  15 min 45#/85#  50 sec on/10 sec off n   HEP Review     N gave green tband for ankle therex   QL stretch seated  X3 hold 15 sec  y   Discontinued superman from HEP.     Assessment:Noted bruising on medial aspect of (R) knee. Mechanical held this visit secondary to increase muscle spasms in back, (R) buttocks, (R)calf, and (R)  knee giving out while standing. Noted TTP in (R) piriformis, QL, and lumbar paraspinals. Noted relaxation of muscle tone following MFR. Modified QL stretch secondary to neck pain and radicular symptoms in (R) LE.     Short Term Goals: 6 Weeks              -BACK - Pt will decrease pain in his cervical spine so that he reports that it is a 0/10 50% of the time.  PROGRESSING, 04/03/2022              -BACK - Pt will decrease pain in his thoracic spine so that he reports that it is a 0/10 50% of the time. PROGRESSING, 04/03/2022              -BACK - Pt will decrease pain in his lumbar spine to intermittent and report no radiculopathy into his legs more than 4/7 days a week.  PROGRESSING, 04/03/2022              -  BACK - Pt will improve his PSFS (functionals skills) to an average of 3 or better. PROGRESSING, 04/03/2022              -BACK - Pt will improve his forward trunk flexion to 20 cm to the floor or less.  PROGRESSING, 04/03/2022              -BACK - Pt will improve his side flexion bilaterally to 35 cm to the floor or less. PROGRESSING, 04/03/2022              -BACK - Pt will improve all LE/trunk/cervical MMT to 4/5.  PROGRESSING, 04/03/2022              -ANKLE - Pt will improve his ankle strength and balance so that he can stand on his R ankle for 10 sec without radiculopathy spreading up his leg. NEW, 04/03/2022              -ANKLE - Pt will improve his R ankle strength to  4/5 in all directions no more than 3 "pops" (crepitus) per 10 reps each direction.  NEW, 04/03/2022              -ANKLE - Pt will improve his R ankle eversion to 10 degrees of AROM. NEW, 04/03/2022           Long Term Goals: 12  Weeks - ALL LTG ARE NOT MET AS OF 04/03/2022              -BACK - Pt will decrease pain in his cervical spine so that he reports that it is a 0/10 75% of the time.                -BACK - Pt will decrease pain in his thoracic spine so that he reports that it is a 0/10 75% of the time.              -BACK - Pt will decrease pain in his lumbar spine to intermittent and report no radiculopathy into his legs more than 2/7 days a week.               -BACK - Pt will improve his PSFS (functionals skills) to an average of 6 or better.              -BACK - Pt will improve his forward trunk flexion to 15 cm to the floor or less.              -BACK - Pt will improve his side flexion bilaterally to 30 cm to the floor or less.              -BACK - Pt will improve all LE/trunk/cervical MMT to 5/5.               -ANKLE - Pt will improve his ankle strength and balance so that he can stand on his R ankle for 20 sec without radiculopathy spreading up his leg. NEW, 04/03/2022              -ANKLE - Pt will improve his R ankle strength to 4+/5 in all directions no more than 3 "pops" (crepitus) per 10 reps each direction.  NEW, 04/03/2022              -ANKLE - Pt will be able to do forward step ups,, lateral steps ups, and step overs on the BOSU ball x 10 each direction per therapy session without  his R ankle giving way or causing an increase in pain.  NEW, 04/03/2022     Plan: Continue with strengthening for back and LE. Assess ROM next visit and if radicular symptoms decrease or same after holding mechanical traction. Progress balance and proprioception as tolerated.                Total Session Time 30 and Timed code minutes 30  JOINT MOBILIZATION/MFR 15 minutes and NEURO RE-EDUCATION 15MINUTES      Carlton Adam, PTA   05/07/2022, 16:18

## 2022-05-09 ENCOUNTER — Other Ambulatory Visit: Payer: Self-pay

## 2022-05-09 ENCOUNTER — Ambulatory Visit (HOSPITAL_COMMUNITY)
Admission: RE | Admit: 2022-05-09 | Discharge: 2022-05-09 | Disposition: A | Discharge status: Still In Hospital | Payer: Worker's Comp, Other unspecified | Source: Ambulatory Visit | Attending: ORTHOPEDIC, SPORTS MEDICINE | Admitting: ORTHOPEDIC, SPORTS MEDICINE

## 2022-05-09 NOTE — PT Treatment (Signed)
[facility]  Outpatient Physical Therapy  [place], [address]  [phone]  [phone]    Physical Therapy Treatment Note    Date: 05/09/2022  Patient's Name: Johnathan Washington  Date of Birth: [birth year]    Visit #/POC:16/36  Authorization:none given  POC Signed: yes  POC Ends: 06/12/2022  Next Progress Note Due: 05/03/2022     Evaluating Physical Therapist: Darrol Jump, MSPT  PT diagnosis/Reason for Referral:   Lumbar sprain - current treatment for this POC  R ankle sprain S93.491 - added to this POC     Orders and PT diagnosis/Reason for Referral also include:   Cervical sprain (not treating now) - Script written 02/13/2022  Thoraic sprain (not treating now) - script written 02/13/2022  R ankle sprain S93.491D (Dr. Durward Fortes - not treating yet) - script written 03/05/2022  Next Scheduled Physician Appointment: 06/17/2022 appointment with Dr. Posey Pronto for back, thoracic, and neck pain.  will talk about back injection at that time.   Pt sees Dr. Durward Fortes for a follow up for the R knee and R foot on 05/23/2022.  We have discussed extending PT for the knee and ankle following that visit.   Dr recommended proprioception and strength training for R leg.    Allergies/Contraindications: allegra, flu shot, and Bee venom     Subjective:  since we started doing mechanical traction, pt has had some new leg symptoms including not being aware of his leg where it is in space (decreased proprioception), decreased balance, numbness in the R lower leg, and R toes were "on fire".   We talked about the mechanical traction causing new issues.  We decided that at this time, we will hold off on the traction.  We may try to do traction again months down the road but at this time, we will stop using traction until all these issues resolve.  -  see discuss/pt education/assessment below for all we discussed and plan moving forwards.      MRI results mild degenerative changes at multiple  levels with disc desiccation, small osteophyte formation, and mild disc space narrowing.  L3-L4 minimal disc bulging.  L4-L5 small R lateral disc herniation with effacement of the epidural fat surroundings the right L4 nerve root with possible compression of the right L4 nerve root.  L5-S1 small central disc herniation with slight effacement of the epidural fat and possible impression of the SI nerve root.       Objective: interventions in therapy today are listed in chart below:         Measured ROM: Not Assessed       EXERCISE/ACTIVITY NAME REPETITIONS RESISTANCE COMPLETED THIS DOS   Nustep Yes - 20 min  yes   Pt education/ safety/ progress/ and POC for therapy Yes - 30 min while also doing NuStep  yes   LAQ    x20 No resistance y; HEP 03/20/2022   Hip abd    R x 30  L x 15 (stopped due to pain and therapist agreement) No resistance  n; HEP   03/20/2022   Hip extension    X30  No resistance n; HEP 03/20/2022   Hip flexion     Marches x30    No resistance n; HEP 03/20/2022   Heel raises  x30   n   Toe raises X30    n   Mini squat    B x 20 No resistance n; HEP 03/20/2022   Hamstring  stretch 1 foot up and 1 foot down    30 sec x 3 No resistance n; HEP 03/20/2022   Manual therapy at cervical, thoracic, and lumbar spine    15 min over the R hip, It band, piriformis, sciatic nerve, and surrounding muscles manual n       standing lumbar ext   x10    n   Short sitting lumbar extension over therapy ball 2 x 10   n; HEP 03/28/2022 to use with standing or in place of standing extension (either 2 x 10 reps multiple times a day)     Patient ed for proper transition in/out of vehicle         n   Ankle proprioception on airex mat SLS 5 sec holds x 5   N   Ankle strengthening PF, DF, Inv, Ever x 10 each  Yellow t-band  N    BOSU step ups fwd x10   no   Balance machine   LOS  Random control  Maze control       X3  X3  x2       Level 8  Level 8  Level 8       n  n  n   Bird dog  x10   n   superman x5   n   Hip abd prone     n   Heel squeeze       n   HEP review/update     n   Dyna disc fwd/backwards weight shift  Ball toss rebounder  Fwd and lateral                2# ball n           n  n fwd /backwards only   Toe raises with ball between heels   2# N    NuStep   L4 - 10 min; no skilled supervision    n(not billed)    Thoracic ext PB with neck supported x10   y   Thread the needle x10   n   Joint mobs thoracic PA     n   Incline stretch 2x 60 sec (B) Large n   Piriformis stretch supine Short sitting cross legs 30 sec x 3   n   MFR (R) piriformis, QL, lumbar paraspinals Trigger point release    n   Traction (lumbar/mechanical)  15 min 45#/85#  50 sec on/10 sec off n   HEP Review     N gave green tband for ankle therex   QL stretch seated  X3 hold 15 sec   no   BAPS  L3 seated x 15 L3 y   Discontinued superman from HEP.     Assessment:  Discussed in length, therapy treatments thus far, safety at home, what to discuss with each doctor, and plans moving forwards.  Here are some of the point discussed today:  - pt is not making progress in therapy with his back (started 04/17/2022) - plateaued   - we will start to focus mostly on his ankle and knee in therapy  - current extension for therapy/POC was written on 04/03/2022 - this is when we started doing 1/2 the session focusing on his back and 1/2 the session focusing on his ankle/knee - it was signed and he is good for back/ankle therapy through 06/12/2022 on current POC.  - pt goes back to see Dr. Posey Pronto for his back 06/17/2022 -  at that time, he will discuss with Posey Pronto the plateau in therapy for his back and we will not seek an extension for further therapy for his back.   - we will discontinue all mechanical traction at this time until noted otherwise  - we will continue to work on balance, ankle strength, leg strength, and proprioception for the R leg.    - pt sees Dr. Durward Fortes on 05/23/2022 who is the doctor that follows his ankle/knee - he will talk to that doctor about continuing therapy for his leg and  knee.  We discussed another 6-8 weeks to work on his knee and R leg which will be approximately through mid Dec.  Pt will talk to Posey Pronto to get an extension (new prescription) for his ankle for 6-8 weeks at 2x/week so we will be able to extend current POC from 06/12/2022 through mid Dec.    - pt understands that he is at risk for falls and will inform his doctors about his recent falls, the symptoms following mechanical traction, his lack of proprioception on the R ankle, the muscle spasms he is having at night, and how often he is twisting his R ankle during the day     Short Term Goals: 6 Weeks              -BACK - Pt will decrease pain in his cervical spine so that he reports that it is a 0/10 50% of the time.  PROGRESSING, 04/03/2022              -BACK - Pt will decrease pain in his thoracic spine so that he reports that it is a 0/10 50% of the time. PROGRESSING, 04/03/2022              -BACK - Pt will decrease pain in his lumbar spine to intermittent and report no radiculopathy into his legs more than 4/7 days a week.  PROGRESSING, 04/03/2022              -BACK - Pt will improve his PSFS (functionals skills) to an average of 3 or better. PROGRESSING, 04/03/2022              -BACK - Pt will improve his forward trunk flexion to 20 cm to the floor or less.  PROGRESSING, 04/03/2022              -BACK - Pt will improve his side flexion bilaterally to 35 cm to the floor or less. PROGRESSING, 04/03/2022              -BACK - Pt will improve all LE/trunk/cervical MMT to 4/5.  PROGRESSING, 04/03/2022              -ANKLE - Pt will improve his ankle strength and balance so that he can stand on his R ankle for 10 sec without radiculopathy spreading up his leg. NEW, 04/03/2022              -ANKLE - Pt will improve his R ankle strength to 4/5 in all directions no more than 3 "pops" (crepitus) per 10 reps each direction.  NEW, 04/03/2022              -ANKLE - Pt will improve his R ankle eversion to 10 degrees of AROM. NEW, 04/03/2022            Long Term Goals: 12  Weeks - ALL LTG ARE NOT MET AS OF 04/03/2022              -  BACK - Pt will decrease pain in his cervical spine so that he reports that it is a 0/10 75% of the time.                -BACK - Pt will decrease pain in his thoracic spine so that he reports that it is a 0/10 75% of the time.              -BACK - Pt will decrease pain in his lumbar spine to intermittent and report no radiculopathy into his legs more than 2/7 days a week.               -BACK - Pt will improve his PSFS (functionals skills) to an average of 6 or better.              -BACK - Pt will improve his forward trunk flexion to 15 cm to the floor or less.              -BACK - Pt will improve his side flexion bilaterally to 30 cm to the floor or less.              -BACK - Pt will improve all LE/trunk/cervical MMT to 5/5.               -ANKLE - Pt will improve his ankle strength and balance so that he can stand on his R ankle for 20 sec without radiculopathy spreading up his leg. NEW, 04/03/2022              -ANKLE - Pt will improve his R ankle strength to 4+/5 in all directions no more than 3 "pops" (crepitus) per 10 reps each direction.  NEW, 04/03/2022              -ANKLE - Pt will be able to do forward step ups,, lateral steps ups, and step overs on the BOSU ball x 10 each direction per therapy session without his R ankle giving way or causing an increase in pain.  NEW, 04/03/2022     Plan: Continue with strengthening for back and LE through 06/12/2022 but begin over the next 5 weeks to focus more on the knee and ankle than the back.  Discontinue all mechanical lumbar traction.  Continue with LE strengthen and ROM exercises to address knee and ankle weakness.  Progress balance and proprioception as tolerated.      Total Session Time 45, Timed code minutes 15, and Untimed code minutes 30  THERAPEUTIC EXERCISE 15 minutes and ADL/IADL TRAINING 30 minutes (this charge was used for the pt education and safety instructions/ POC  education/ and discussion that was completed the first 30 min of the session)      Darrol Jump  05/09/2022, 18:04

## 2022-05-14 ENCOUNTER — Ambulatory Visit
Admission: RE | Admit: 2022-05-14 | Discharge: 2022-05-14 | Disposition: A | Discharge status: Still In Hospital | Payer: Worker's Comp, Other unspecified | Source: Ambulatory Visit | Attending: ORTHOPEDIC, SPORTS MEDICINE | Admitting: ORTHOPEDIC, SPORTS MEDICINE

## 2022-05-14 ENCOUNTER — Other Ambulatory Visit: Payer: Self-pay

## 2022-05-14 NOTE — PT Treatment (Addendum)
[facility]  Outpatient Physical Therapy  [place], [address]  ([phone]  [phone]    Physical Therapy Treatment Note  PROGRESS NOTE  DISCHARGE SUMMARY    Date: 05/14/2022  Patient's Name: Johnathan Washington  Date of Birth: 02-Aug-1970    Visit #/POC:17/36  Authorization:none given  POC Signed: yes  POC Ends: 06/12/2022  Next Progress Note Due: 11/31/2023     Evaluating Physical Therapist: Darrol Jump, MSPT  PT diagnosis/Reason for Referral:   Lumbar sprain - current treatment for this POC  R ankle sprain S93.491 - added to this POC     Orders and PT diagnosis/Reason for Referral also include:   Cervical sprain (not treating now) - Script written 02/13/2022  Thoraic sprain (not treating now) - script written 02/13/2022  R ankle sprain S93.491D (Dr. Durward Fortes - not treating yet) - script written 03/05/2022  Next Scheduled Physician Appointment: 06/17/2022 appointment with Dr. Posey Pronto for back, thoracic, and neck pain.  will talk about back injection at that time.   Pt sees Dr. Durward Fortes for a follow up for the R knee and R foot on 05/23/2022.  We have discussed extending PT for the knee and ankle following that visit.   Dr recommended proprioception and strength training for R leg.    Allergies/Contraindications: allegra, flu shot, and Bee venom     Subjective:    Pt feels that he is leaning to the L side and PT is in agreement.  He reports that his ankle feels like it is going to roll his ankle on the R lateral malleolus.    Past notes - NOTE ABOUT TRACTION- since we started doing mechanical traction, pt had some new leg symptoms including not being aware of his leg where it is in space (decreased proprioception), decreased balance, numbness in the R lower leg, and R toes were "on fire".   We talked about the mechanical traction possibly being the cause of the new issues.  We decided that at this time, we will hold off on the mechanical lumbar notraction.  We may  try to do traction again months down the road but at this time, we will stop using traction until all these issues resolve.  -  see discuss/pt education/assessment below for all we discussed and plan moving forwards.      MRI results mild degenerative changes at multiple levels with disc desiccation, small osteophyte formation, and mild disc space narrowing.  L3-L4 minimal disc bulging.  L4-L5 small R lateral disc herniation with effacement of the epidural fat surroundings the right L4 nerve root with possible compression of the right L4 nerve root.  L5-S1 small central disc herniation with slight effacement of the epidural fat and possible impression of the SI nerve root.       Objective: interventions in therapy today are listed in chart below:         Measured ROM: Not Assessed       EXERCISE/ACTIVITY NAME REPETITIONS RESISTANCE COMPLETED THIS DOS   Nustep Yes - 20 min   no   Pt education/ safety/ progress/ and POC for therapy Yes - 30 min while also doing NuStep   no   LAQ    x20 No resistance no; HEP 03/20/2022   Hip abd    Standing at UE support x 15 each side No resistance  y; HEP   03/20/2022   Hip extension    X15 standing at UE support No resistance  Y; HEP 03/20/2022   Hip flexion     Marches x15    No resistance y; HEP 03/20/2022   Heel raises  X15 standing at UE support   y   Toe raises X30    n   Mini squat    B x 15 standing at UE support No resistance y; HEP 03/20/2022   Side leaning for trunk strengthening In standing to each side  To the R x 15  To the L 3 x 15  y   Hamstring stretch 1 foot up and 1 foot down    30 sec x 3 No resistance n; HEP 03/20/2022   Manual therapy at cervical, thoracic, and lumbar spine    15 min over the R hip, It band, piriformis, sciatic nerve, and surrounding muscles manual n       standing lumbar ext   x10    n   Short sitting lumbar extension over therapy ball 2 x 10   n; HEP 03/28/2022 to use with standing or in place of standing extension (either 2 x 10 reps multiple times a day)      Patient ed for proper transition in/out of vehicle         n   Ankle proprioception on airex mat SLS 5 sec holds x 5   N   Ankle strengthening PF, DF, Inv, Ever x 10 each  Yellow t-band  N    BOSU step ups fwd x10   no   Balance machine   LOS  Random control  Maze control       X3  X3  x2       Level 8  Level 8  Level 8       n  n  n   Bird dog  x10   n   superman x5   n   Hip abd prone     n   Heel squeeze      n   HEP review/update     n   Dyna disc fwd/backwards weight shift  Ball toss rebounder  Fwd and lateral                2# ball n           n  n fwd /backwards only   Toe raises with ball between heels   2# N    NuStep   L4 - 10 min; no skilled supervision    n(not billed)    Thoracic ext PB with neck supported x10   y   Thread the needle x10   n   Joint mobs thoracic PA     n   Incline stretch 2x 60 sec (B) Large n   Piriformis stretch supine Short sitting cross legs 30 sec x 3   n   MFR (R) piriformis, QL, lumbar paraspinals Trigger point release    n   Traction (lumbar/mechanical)  15 min 45#/85#  50 sec on/10 sec off n   HEP Review     N gave green tband for ankle therex   QL stretch seated  X3 hold 15 sec   no   BAPS  L3 seated x 15 L3 y   Kinesio tape Over tibulofibular (posterior tib tendon) on the lateral side of the R ankle around the malleolus - 3 "I" strips using 50% tension   yes      Assessment:  PT tried Kinesio tape on  the R lateral ankle to give pt some support to prevent supination and twisting his ankle (rolling over the lateral side of his ankle).  Pt reported he liked it during the session and wore it home.  He was to continue wearing the kinesio tape until Thursday night this week.  He will plan to have it re-taped next session and wear that until 05/22/2022.  Pt sees the doctor on 05/23/2022, so he plans to take tape off prior to that appointment so that the doctor can assess the R ankle without the support of the Kinesio tape. If pt likes the Kinesio tape support, PT/PTA can teach pt  in future session how to apply tape on his own.  Continue with LE strengthening and R ankle/knee proprioception training.     As of 05/09/2022 moving forward with POC (some of the point discussed):  - pt is not making progress in therapy with his back (started 04/17/2022) - plateaued   - we will start to focus mostly on his ankle and knee in therapy  - current extension for therapy/POC was written on 04/03/2022 - this is when we started doing 1/2 the session focusing on his back and 1/2 the session focusing on his ankle/knee - it was signed and he is good for back/ankle therapy through 06/12/2022 on current POC.  - pt goes back to see Dr. Posey Pronto for his back 06/17/2022 - at that time, he will discuss with Posey Pronto the plateau in therapy for his back and we will not seek an extension for further therapy for his back.   - we will discontinue all mechanical traction at this time until noted otherwise  - we will continue to work on balance, ankle strength, leg strength, and proprioception for the R leg.    - pt sees Dr. Durward Fortes on 05/23/2022 who is the doctor that follows his ankle/knee - he will talk to that doctor about continuing therapy for his leg and knee.  We discussed another 6-8 weeks to work on his knee and R leg which will be approximately through mid Dec.  Pt will talk to Posey Pronto to get an extension (new prescription) for his ankle for 6-8 weeks at 2x/week so we will be able to extend current POC from 06/12/2022 through mid Dec.    - pt understands that he is at risk for falls and will inform his doctors about his recent falls, the symptoms following mechanical traction, his lack of proprioception on the R ankle, the muscle spasms he is having at night, and how often he is twisting his R ankle during the day    ADDENDUM - 07/10/2022 - Pt was last seen on 05/14/2022.  Current functional status is unknown.  Discharge at this time.      Short Term Goals: 6 Weeks              -BACK - Pt will decrease pain in his  cervical spine so that he reports that it is a 0/10 50% of the time.  PROGRESSING, 05/14/2022              -BACK - Pt will decrease pain in his thoracic spine so that he reports that it is a 0/10 50% of the time. PROGRESSING, 05/14/2022              -BACK - Pt will decrease pain in his lumbar spine to intermittent and report no radiculopathy into his legs more than 4/7 days a week.  PROGRESSING, 05/14/2022              -  BACK - Pt will improve his PSFS (functionals skills) to an average of 3 or better. PROGRESSING, 05/14/2022              -BACK - Pt will improve his forward trunk flexion to 20 cm to the floor or less.  PROGRESSING, 05/14/2022              -BACK - Pt will improve his side flexion bilaterally to 35 cm to the floor or less. PROGRESSING, 05/14/2022              -BACK - Pt will improve all LE/trunk/cervical MMT to 4/5.  PROGRESSING, 05/14/2022              -ANKLE - Pt will improve his ankle strength and balance so that he can stand on his R ankle for 10 sec without radiculopathy spreading up his leg. NOT MET, 05/14/2022              -ANKLE - Pt will improve his R ankle strength to 4/5 in all directions no more than 3 "pops" (crepitus) per 10 reps each direction.  PROGRESSING, 05/14/2022              -ANKLE - Pt will improve his R ankle eversion to 10 degrees of AROM. NOT MET, 05/14/2022           Long Term Goals: 12  Weeks - ALL LTG ARE NOT MET AS OF 05/14/2022              -BACK - Pt will decrease pain in his cervical spine so that he reports that it is a 0/10 75% of the time.                -BACK - Pt will decrease pain in his thoracic spine so that he reports that it is a 0/10 75% of the time.              -BACK - Pt will decrease pain in his lumbar spine to intermittent and report no radiculopathy into his legs more than 2/7 days a week.               -BACK - Pt will improve his PSFS (functionals skills) to an average of 6 or better.              -BACK - Pt will improve his forward trunk flexion to 15  cm to the floor or less.              -BACK - Pt will improve his side flexion bilaterally to 30 cm to the floor or less.              -BACK - Pt will improve all LE/trunk/cervical MMT to 5/5.               -ANKLE - Pt will improve his ankle strength and balance so that he can stand on his R ankle for 20 sec without radiculopathy spreading up his leg.               -ANKLE - Pt will improve his R ankle strength to 4+/5 in all directions no more than 3 "pops" (crepitus) per 10 reps each direction.                -ANKLE - Pt will be able to do forward step ups,, lateral steps ups, and step overs on the BOSU ball x 10 each direction per therapy session without  his R ankle giving way or causing an increase in pain.       Plan: Continue with strengthening for back and pain management/treatment through 06/12/2022.  Over the next several weeks shift focus primarily to his R knee and ankle as he has reached a plateau with progress of his back with therapy.  Discontinue all mechanical lumbar traction until further notice.  Continue with LE strengthen and ROM exercises to address knee and ankle weakness.  Progress balance and proprioception as tolerated.     ADDENDUM 07/10/2022 - Discharge at this time.       Total Session Time 45 and Timed code minutes 45  THERAPEUTIC EXERCISE 30 minutes and NEURO RE-EDUCATION 15MINUTES    Darrol Jump  05/14/2022, 17:36  Darrol Jump, 07/10/2022, 15:00

## 2022-05-17 ENCOUNTER — Other Ambulatory Visit: Payer: Self-pay

## 2022-05-17 ENCOUNTER — Ambulatory Visit (HOSPITAL_COMMUNITY)
Admission: RE | Admit: 2022-05-17 | Payer: Worker's Comp, Other unspecified | Source: Ambulatory Visit | Attending: ORTHOPEDIC, SPORTS MEDICINE | Admitting: ORTHOPEDIC, SPORTS MEDICINE

## 2022-05-20 ENCOUNTER — Ambulatory Visit (HOSPITAL_COMMUNITY): Payer: Self-pay

## 2022-05-22 ENCOUNTER — Ambulatory Visit (HOSPITAL_COMMUNITY): Payer: Self-pay

## 2022-05-27 ENCOUNTER — Ambulatory Visit (HOSPITAL_COMMUNITY): Payer: Self-pay

## 2022-05-29 ENCOUNTER — Ambulatory Visit (HOSPITAL_COMMUNITY): Payer: Self-pay

## 2022-09-16 IMAGING — MR MRI ANKLE RT WO CONTRAST
4 of 6 series · 22 of 40 positions shown · IV contrast (gadolinium)
Comparison: No prior imaging studies of the right ankle are available for comparison.

﻿EXAM:  [id number]   MRI ANKLE RT WO CONTRAST
INDICATION: 51-year-old male sustained twisting injury recently.  Possible sprained lateral ligament.  No prior surgery.
TECHNIQUE: Multiplanar, multisequential MRI of the right ankle was performed without gadolinium contrast.

[Series 8: T1 · sagittal · right · 4.0mm · 0.38mm/px · 5 of 20 slices shown (1 of 3)]
[im 1/20]
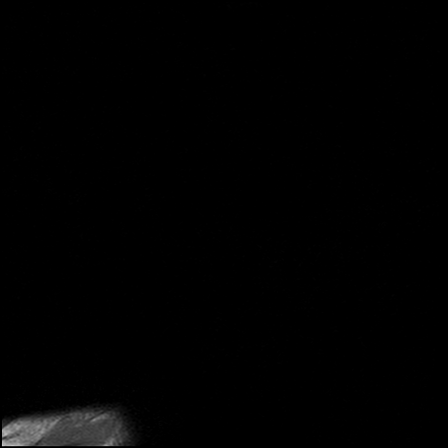
[im 5/20]
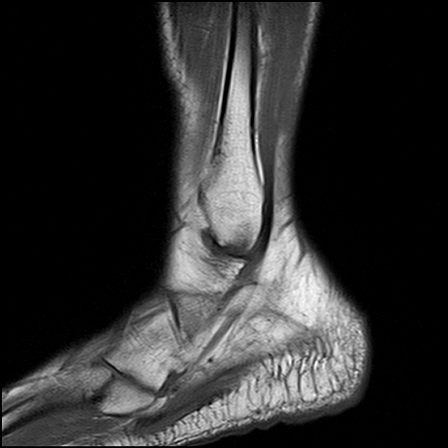
[im 10/20]
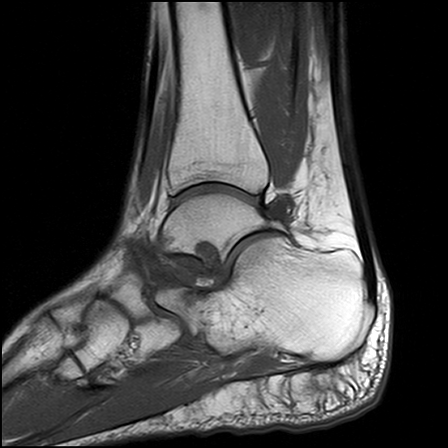
[im 15/20]
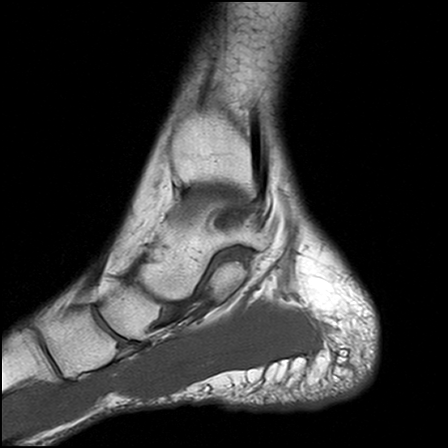
[im 20/20]
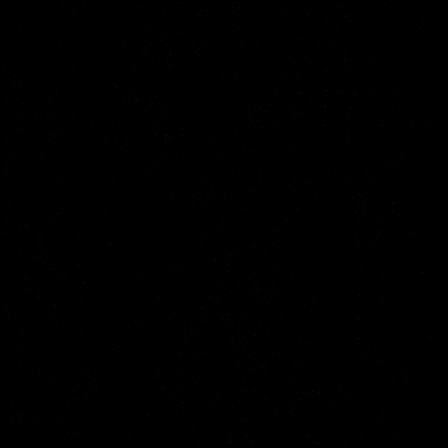

[Series 10: T1 · axial · right · 4.0mm · 0.33mm/px · z∈[-82,+21]mm · 6 of 28 slices shown (2 of 3)]
[im 1/28]
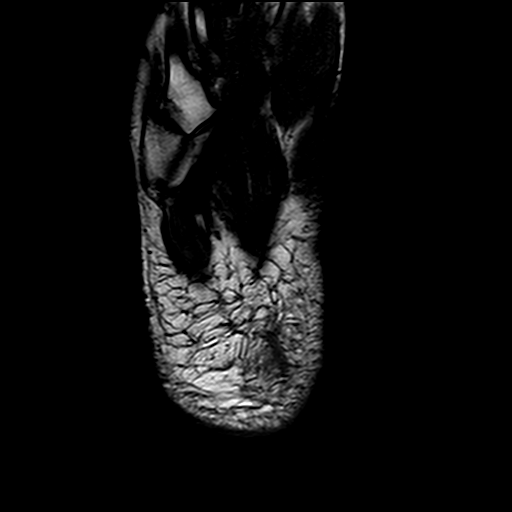
[im 4/28]
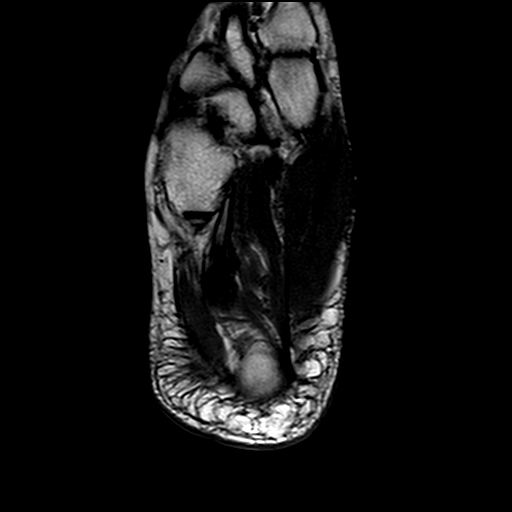
[im 8/28]
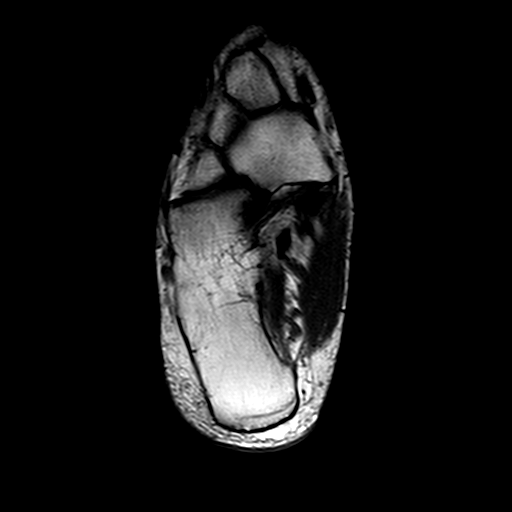
[im 12/28]
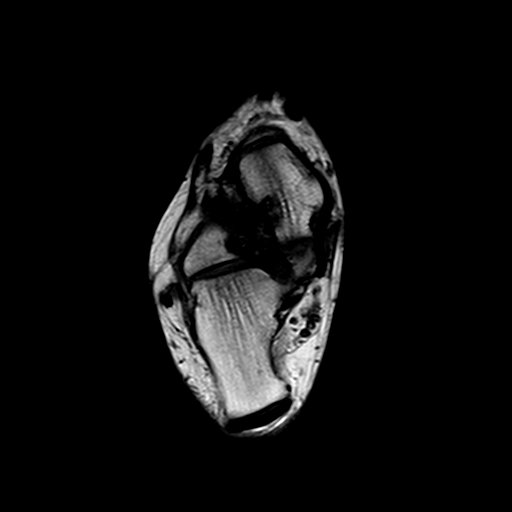
[im 16/28]
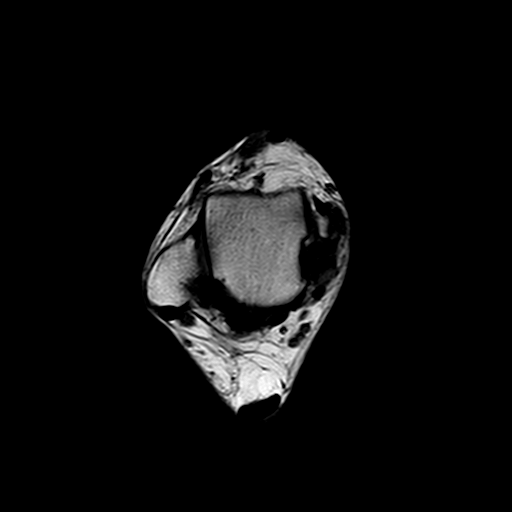
[im 24/28]
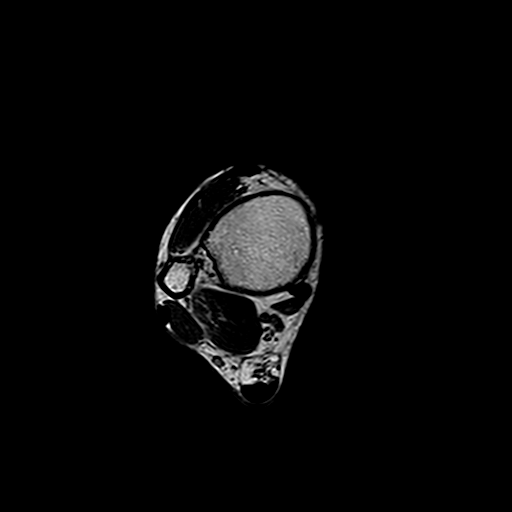

[Series 11: T2 fat-sat · axial · right · 4.0mm · 0.38mm/px · z∈[-82,+39]mm · 8 of 28 slices shown]
[im 1/28]
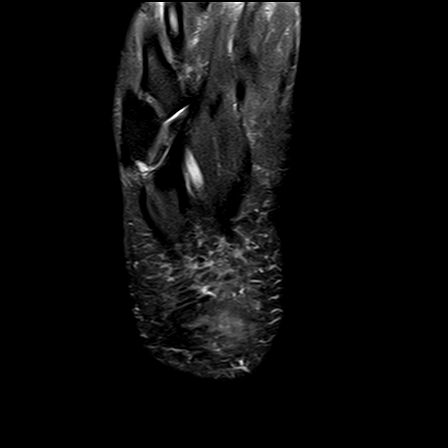
[im 4/28]
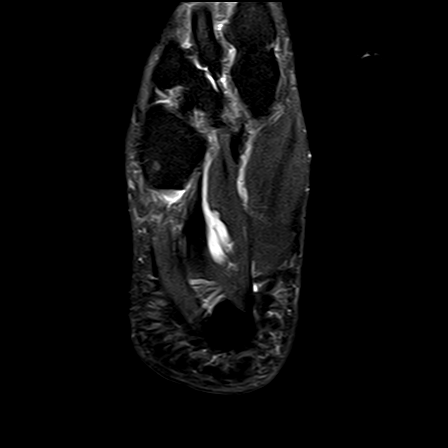
[im 8/28]
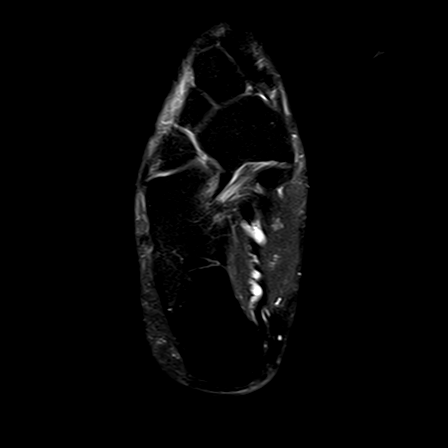
[im 12/28]
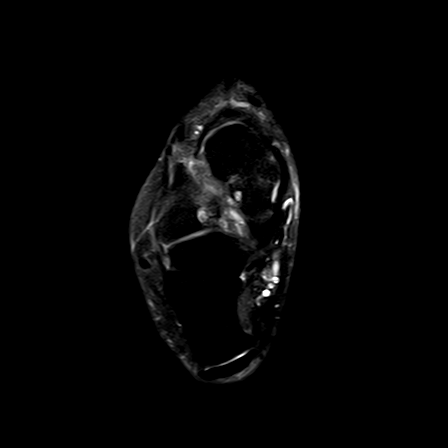
[im 16/28]
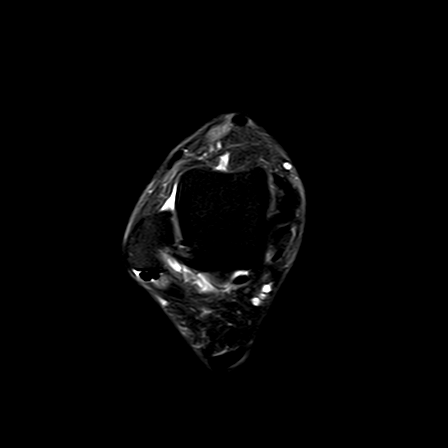
[im 20/28]
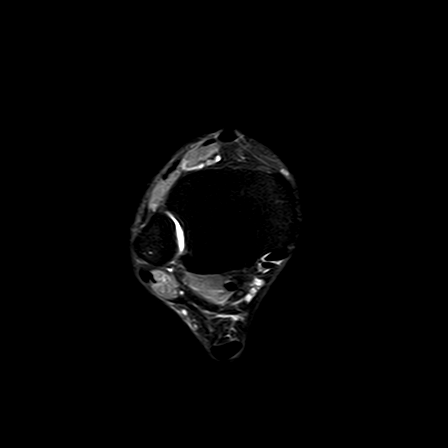
[im 24/28]
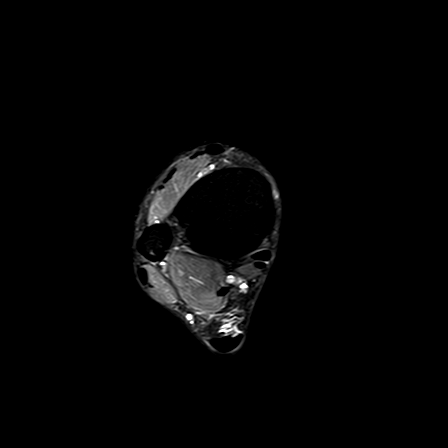
[im 28/28]
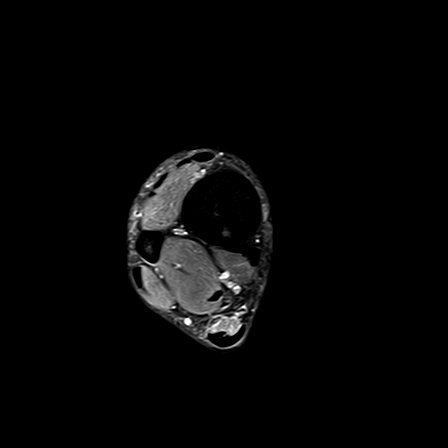

[Series 12: T1 · coronal · right · 4.0mm · 0.29mm/px · 3 of 26 slices shown (3 of 3)]
[im 5/26]
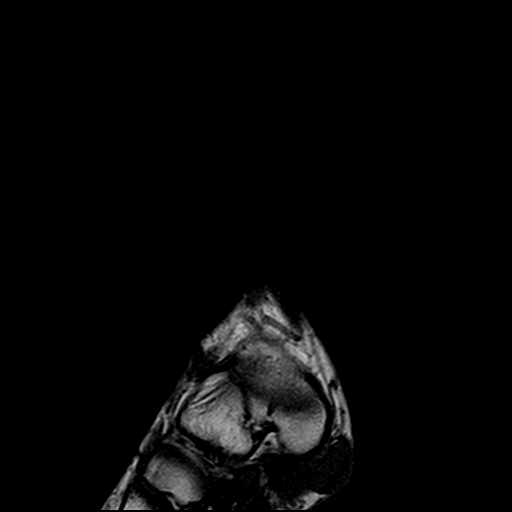
[im 13/26]
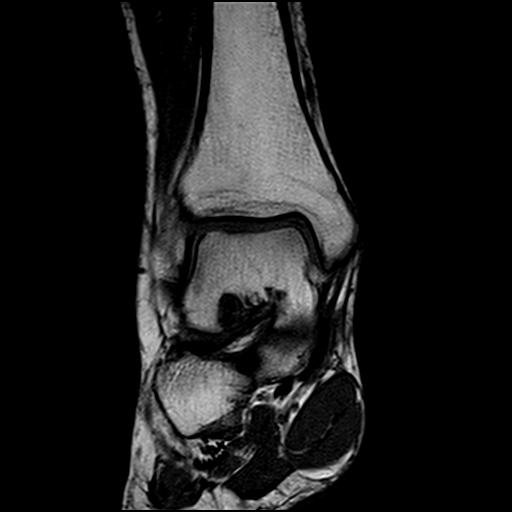
[im 21/26]
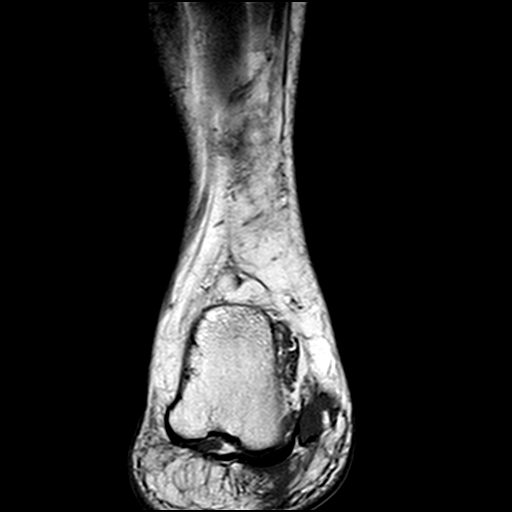

[22 of 40 positions shown; findings below may reference images not displayed]

FINDINGS: No acute fracture or bone bruises are seen at the right ankle. 

Distal tibiofibular syndesmosis and tibiofibular ligaments are intact.  There is no widening of ankle mortise.

Medial collateral ligament is intact. 

Anterior and posterior talofibular ligaments and calcaneofibular ligaments do not show any full-thickness disruption.  Mild edema of the anterior talofibular ligament suggests mild grade 1 sprain.

Peroneal tendons are intact. 

Articular surface of dome of talus shows no acute findings. Small subchondral cyst formation in the superior lateral corner of talus suggests osteochondritis of mild degree. 

Severe degenerative arthritic changes of the subtalar joint are noted with subchondral cyst formation and inflammation on both sides of the anterior and posterior compartments.  Achilles tendon, medial tendons and plantar fascia are intact.
IMPRESSION: 1. No acute bony lesions at the right ankle. 

2. Mild grade 1 sprain of the anterior talofibular ligament.  There is no disruption of the ligaments on the lateral aspect of the ankle.

3. Mild changes of osteochondritis of superior lateral corner of articular surface of the talus at the dome of the talus. 

4. Severe arthritic changes of the subtalar joint with inflammatory changes and cystic changes on both sides of the joint.

## 2023-07-11 IMAGING — MR MRI LUMBAR SPINE WITHOUT CONTRAST
4 of 6 series · 29 of 48 positions shown · IV contrast (gadolinium)
Comparison: MRI lumbar spine dated 02/16/2021.

﻿EXAM:  [id number]   MRI LUMBAR SPINE WITHOUT CONTRAST
INDICATION: Chronic low back pain. History of trauma due to fall last year. Re-injury 3 months ago. No history of back surgery.
TECHNIQUE: Multiplanar, multisequential MRI of the lumbosacral spine was performed without gadolinium contrast.

[Series 5: T2 · sagittal · 5.0mm · 0.94mm/px · 6 of 13 slices shown (1 of 3)]
[im 1/13]
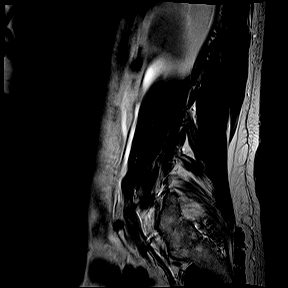
[im 3/13]
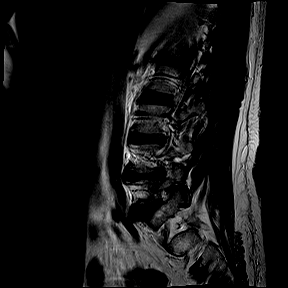
[im 5/13]
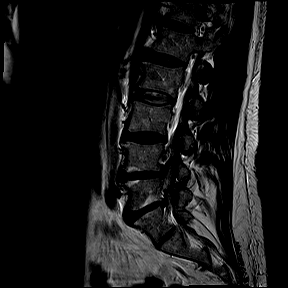
[im 8/13]
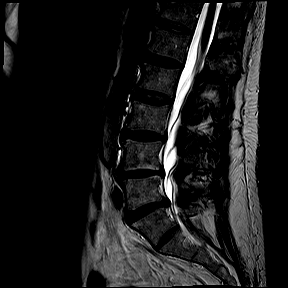
[im 10/13]
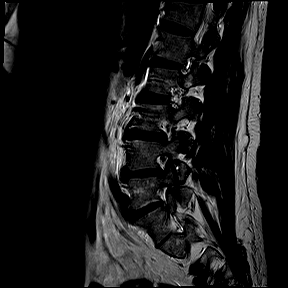
[im 13/13]
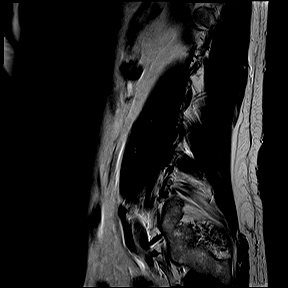

[Series 6: T1 · sagittal · 5.0mm · 0.94mm/px · 4 of 13 slices shown]
[im 1/13]
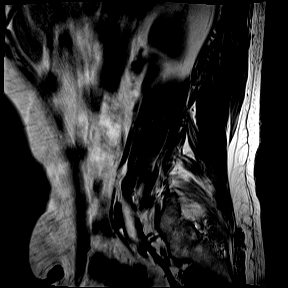
[im 3/13]
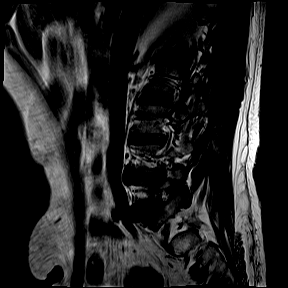
[im 8/13]
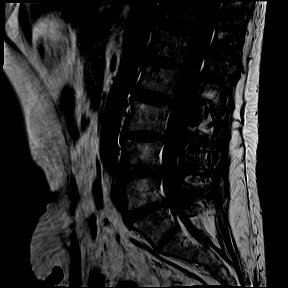
[im 13/13]
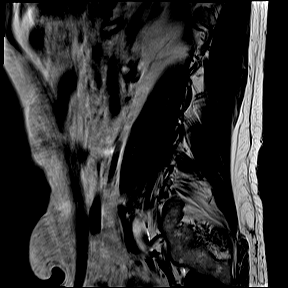

[Series 8: T2 · coronal · 5.0mm · 0.82mm/px · 8 of 18 slices shown (2 of 3)]
[im 1/18]
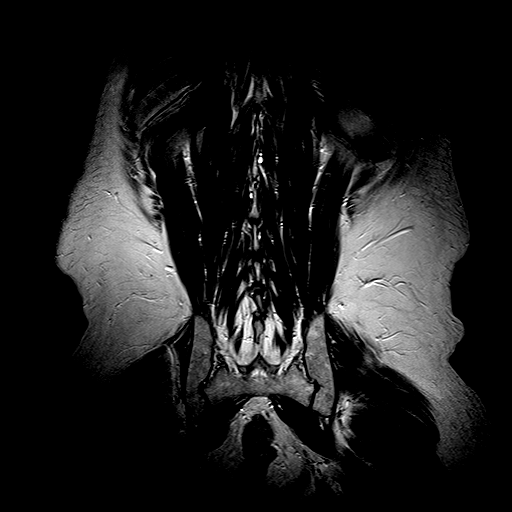
[im 3/18]
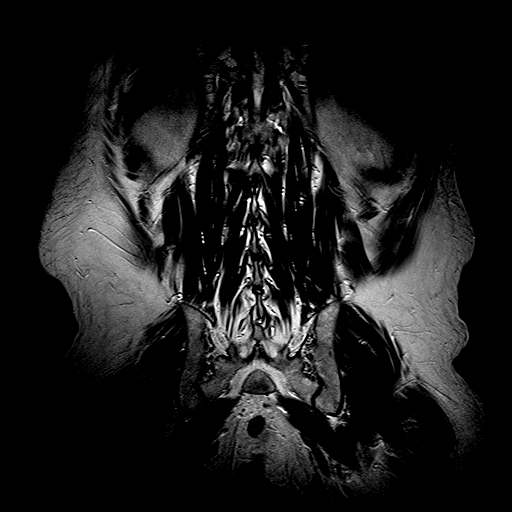
[im 5/18]
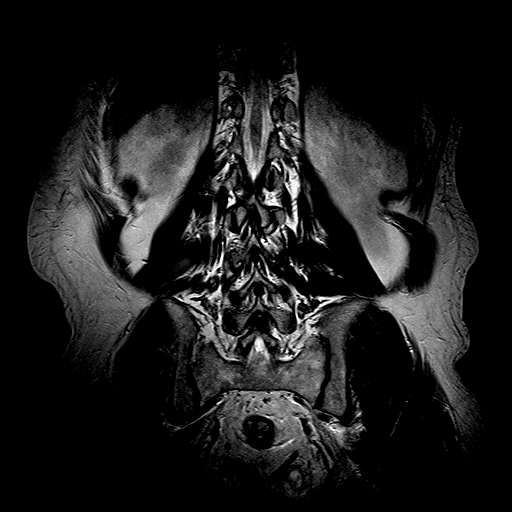
[im 8/18]
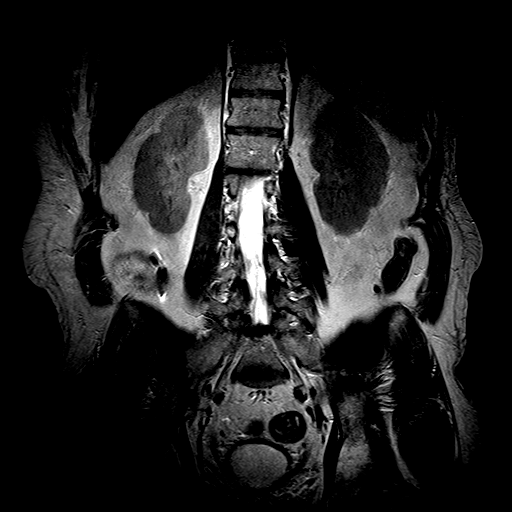
[im 10/18]
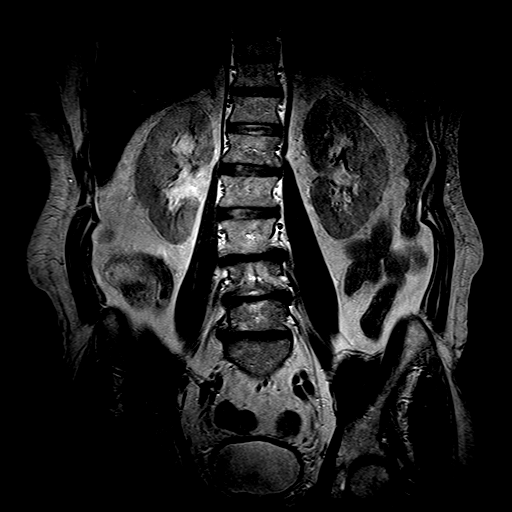
[im 13/18]
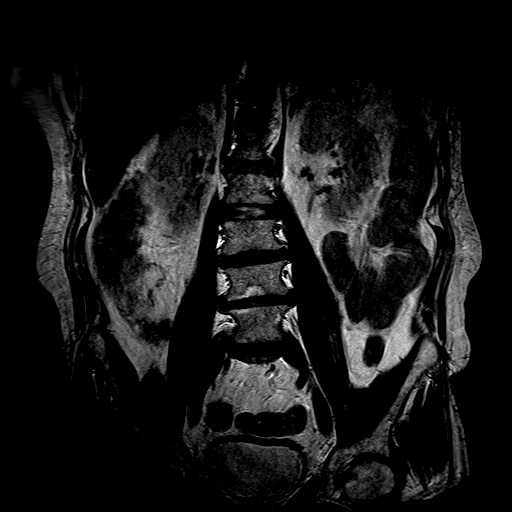
[im 15/18]
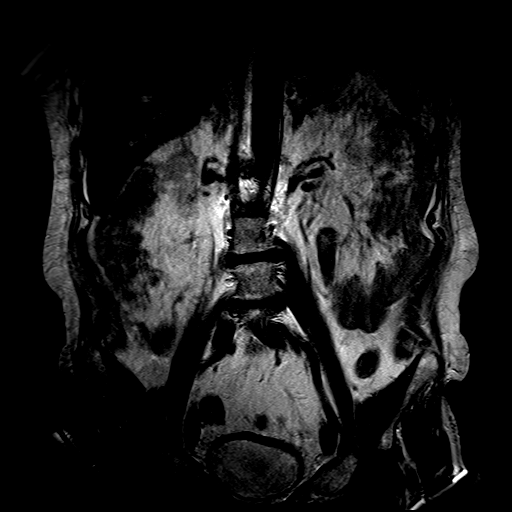
[im 18/18]
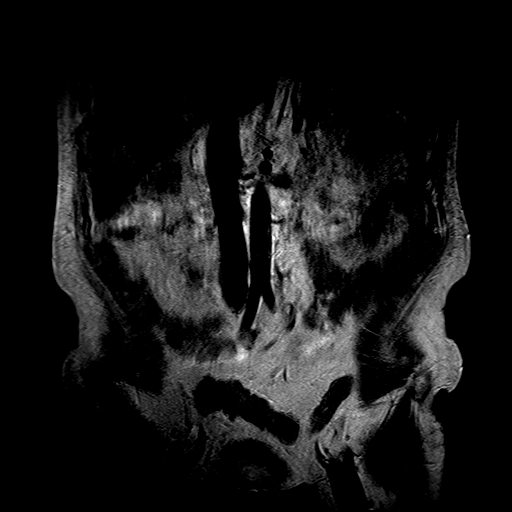

[Series 9: T2 · axial · 4.0mm · 0.52mm/px · z∈[-98,+100]mm · 11 of 23 slices shown (3 of 3)]
[im 1/23]
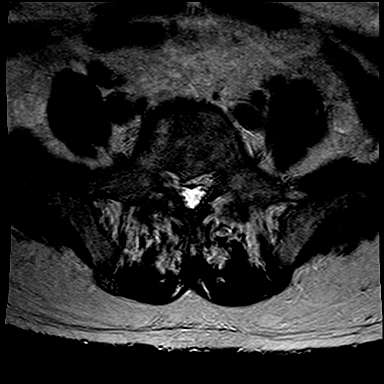
[im 3/23]
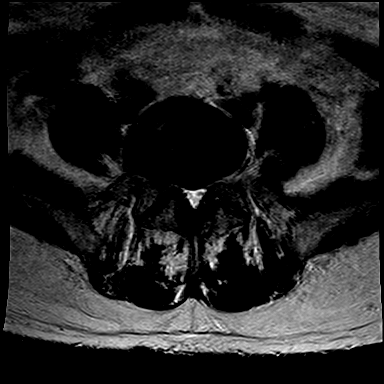
[im 5/23]
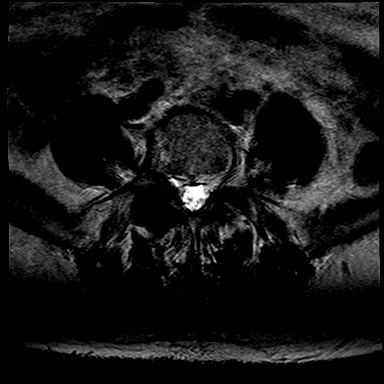
[im 7/23]
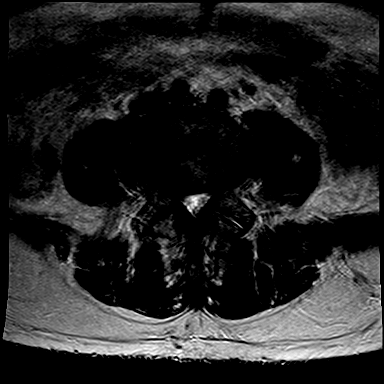
[im 9/23]
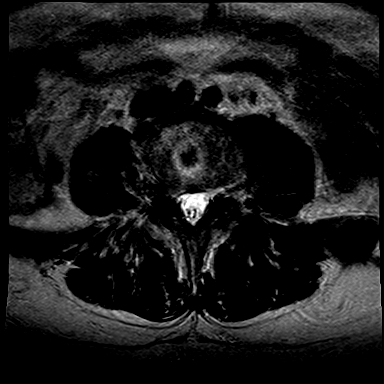
[im 12/23]
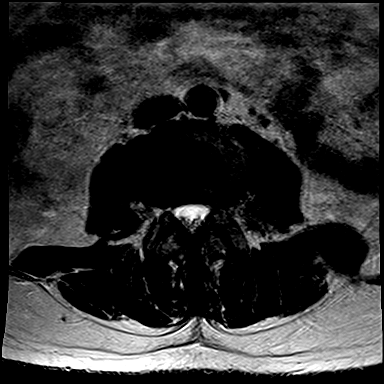
[im 14/23]
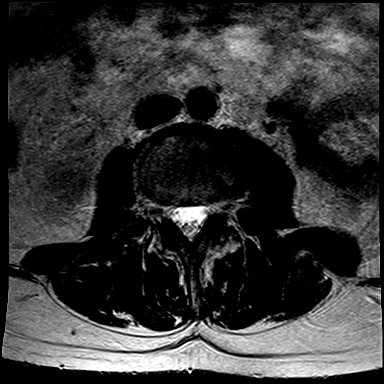
[im 16/23]
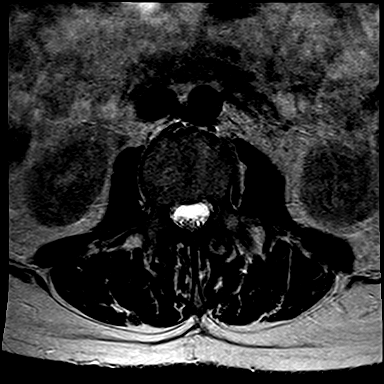
[im 18/23]
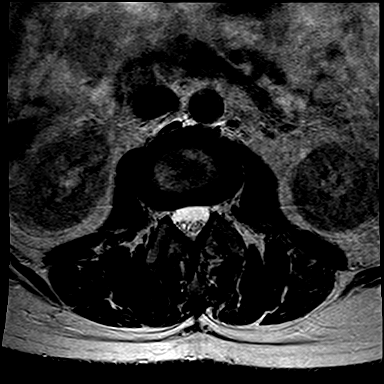
[im 20/23]
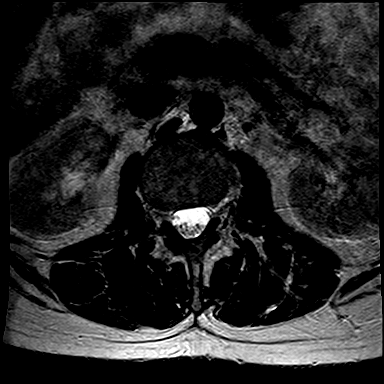
[im 23/23]
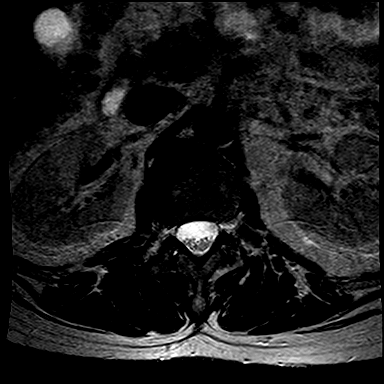

[29 of 48 positions shown; findings below may reference images not displayed]

FINDINGS: Modic type 1 inflammatory changes on both sides of L4-5 disc are noted and to a lesser degree at L5-S1 level not seen on previous examination. 

Transitional lumbosacral junction with the lowest visible disc labeled S1-S2 for the purposes of this report and also consistent with labeling on prior examination.

Conus terminates at L1 level.

L1-2 disc shows no focal lesions. L2-3 disc shows no focal lesions.

At L3-4 level, degenerative disc disease with bulging annulus and facet arthropathy are causing mild compromise of both lateral recess similar to prior study. 

At L4-5 level, significant degenerative disc disease is noted along with hypertrophy of dorsal ligaments and facet arthropathy and asymmetric bulging annulus to the right.  Severe compromise of right neural foramen and significant compromise of left neural foramen are noted.  Compromise of thecal sac in the midline with AP diameter measuring 10 mm. Overall findings at L4-5 level are unchanged from prior study except for the appearance of Modic type 1 inflammatory changes not seen before at this disc level.

At L5-S1 level, severe degenerative disc disease with central disc protrusion impinging on thecal sac and both lateral recess similar to prior study.  Bulging annulus and facet arthropathy are causing significant compromise of both neural foramina.  Compromise of thecal sac with AP diameter in the midline measuring 9 mm. Findings at this level are unchanged from prior study. 

S1-S2 disc is normal. Paravertebral soft tissues are unremarkable.
IMPRESSION: 1. Transitional lumbosacral junction as mentioned above. Lowest visible disc for the purposes of this report is labeled S1-S2. Any surgical procedure must be preceded by confirmation of disc level. 

2. At L4-5 level, significant degenerative disc disease is noted along with hypertrophy of dorsal ligaments and facet arthropathy and asymmetric bulging annulus to the right.  Severe compromise of right neural foramen and significant compromise of left neural foramen are noted.  Compromise of thecal sac in the midline with AP diameter measuring 10 mm. Overall findings at L4-5 level are unchanged from prior study except for the appearance of Modic type 1 inflammatory changes not seen before at this disc level.

3. At L5-S1 level, severe degenerative disc disease with central disc protrusion impinging on thecal sac and both lateral recess similar to prior study.  Bulging annulus and facet arthropathy are causing significant compromise of both neural foramina.  Compromise of thecal sac with AP diameter in the midline measuring 9 mm. Findings at this level are unchanged from prior study. 

4. Findings described above are a combination of chronic degenerative disc changes and facet arthropathy along with superimposed acute or subacute Modic type 1 inflammatory changes at the L4-5 disc.
